# Patient Record
Sex: Female | Born: 1940 | Race: White | Hispanic: No | Smoking: Former smoker
Health system: Southern US, Community
[De-identification: ages and names within clinical notes are randomized; demographics above are authoritative.]

## PROBLEM LIST (undated history)

## (undated) DIAGNOSIS — E079 Disorder of thyroid, unspecified: Secondary | ICD-10-CM

## (undated) DIAGNOSIS — F32A Depression, unspecified: Secondary | ICD-10-CM

## (undated) DIAGNOSIS — M1990 Unspecified osteoarthritis, unspecified site: Secondary | ICD-10-CM

## (undated) DIAGNOSIS — M8580 Other specified disorders of bone density and structure, unspecified site: Secondary | ICD-10-CM

## (undated) DIAGNOSIS — F329 Major depressive disorder, single episode, unspecified: Secondary | ICD-10-CM

## (undated) HISTORY — PX: ABDOMINAL HYSTERECTOMY: SHX81

## (undated) HISTORY — DX: Unspecified osteoarthritis, unspecified site: M19.90

## (undated) HISTORY — DX: Other specified disorders of bone density and structure, unspecified site: M85.80

## (undated) HISTORY — DX: Disorder of thyroid, unspecified: E07.9

## (undated) HISTORY — DX: Depression, unspecified: F32.A

## (undated) HISTORY — PX: TUBAL LIGATION: SHX77

## (undated) HISTORY — DX: Major depressive disorder, single episode, unspecified: F32.9

## (undated) HISTORY — PX: CORONARY ARTERY BYPASS GRAFT: SHX141

---

## 1998-05-13 ENCOUNTER — Other Ambulatory Visit: Admission: RE | Admit: 1998-05-13 | Discharge: 1998-06-12 | Payer: Self-pay

## 1998-07-02 ENCOUNTER — Ambulatory Visit (HOSPITAL_COMMUNITY): Admission: RE | Admit: 1998-07-02 | Discharge: 1998-07-02 | Payer: Self-pay | Admitting: Gastroenterology

## 1999-03-30 ENCOUNTER — Other Ambulatory Visit: Admission: RE | Admit: 1999-03-30 | Discharge: 1999-03-30 | Payer: Self-pay

## 2004-06-15 ENCOUNTER — Other Ambulatory Visit: Admission: RE | Admit: 2004-06-15 | Discharge: 2004-06-15 | Payer: Self-pay | Admitting: Family Medicine

## 2005-03-03 ENCOUNTER — Ambulatory Visit (HOSPITAL_COMMUNITY): Admission: RE | Admit: 2005-03-03 | Discharge: 2005-03-03 | Payer: Self-pay | Admitting: Internal Medicine

## 2005-03-03 ENCOUNTER — Ambulatory Visit: Payer: Self-pay | Admitting: Internal Medicine

## 2005-03-03 ENCOUNTER — Encounter (INDEPENDENT_AMBULATORY_CARE_PROVIDER_SITE_OTHER): Payer: Self-pay | Admitting: *Deleted

## 2005-03-23 ENCOUNTER — Ambulatory Visit: Payer: Self-pay | Admitting: Psychiatry

## 2005-06-21 ENCOUNTER — Other Ambulatory Visit: Admission: RE | Admit: 2005-06-21 | Discharge: 2005-06-21 | Payer: Self-pay | Admitting: Family Medicine

## 2006-07-11 ENCOUNTER — Ambulatory Visit: Payer: Self-pay | Admitting: Gastroenterology

## 2006-07-17 ENCOUNTER — Ambulatory Visit: Payer: Self-pay | Admitting: Gastroenterology

## 2006-12-01 ENCOUNTER — Ambulatory Visit (HOSPITAL_COMMUNITY): Payer: Self-pay | Admitting: Psychiatry

## 2006-12-15 ENCOUNTER — Ambulatory Visit (HOSPITAL_COMMUNITY): Payer: Self-pay | Admitting: Psychiatry

## 2006-12-22 ENCOUNTER — Ambulatory Visit (HOSPITAL_COMMUNITY): Payer: Self-pay | Admitting: Psychiatry

## 2007-01-02 ENCOUNTER — Ambulatory Visit (HOSPITAL_COMMUNITY): Payer: Self-pay | Admitting: Psychiatry

## 2007-01-11 ENCOUNTER — Ambulatory Visit (HOSPITAL_COMMUNITY): Payer: Self-pay | Admitting: Psychiatry

## 2007-01-19 ENCOUNTER — Ambulatory Visit (HOSPITAL_COMMUNITY): Payer: Self-pay | Admitting: Psychiatry

## 2007-01-30 ENCOUNTER — Ambulatory Visit (HOSPITAL_COMMUNITY): Payer: Self-pay | Admitting: Psychiatry

## 2007-02-01 ENCOUNTER — Ambulatory Visit (HOSPITAL_COMMUNITY): Payer: Self-pay | Admitting: Psychiatry

## 2007-02-13 ENCOUNTER — Ambulatory Visit (HOSPITAL_COMMUNITY): Payer: Self-pay | Admitting: Psychiatry

## 2007-02-19 ENCOUNTER — Ambulatory Visit (HOSPITAL_COMMUNITY): Payer: Self-pay | Admitting: Psychiatry

## 2007-03-06 ENCOUNTER — Ambulatory Visit (HOSPITAL_COMMUNITY): Payer: Self-pay | Admitting: Psychiatry

## 2007-03-15 ENCOUNTER — Ambulatory Visit (HOSPITAL_COMMUNITY): Payer: Self-pay | Admitting: Psychiatry

## 2007-03-21 ENCOUNTER — Ambulatory Visit (HOSPITAL_COMMUNITY): Payer: Self-pay | Admitting: Psychiatry

## 2007-04-11 ENCOUNTER — Ambulatory Visit (HOSPITAL_COMMUNITY): Payer: Self-pay | Admitting: Psychiatry

## 2007-05-17 ENCOUNTER — Ambulatory Visit (HOSPITAL_COMMUNITY): Payer: Self-pay | Admitting: Psychiatry

## 2007-05-21 ENCOUNTER — Ambulatory Visit (HOSPITAL_COMMUNITY): Payer: Self-pay | Admitting: Psychiatry

## 2007-05-30 ENCOUNTER — Ambulatory Visit (HOSPITAL_COMMUNITY): Payer: Self-pay | Admitting: Psychiatry

## 2007-06-14 ENCOUNTER — Ambulatory Visit (HOSPITAL_COMMUNITY): Payer: Self-pay | Admitting: Psychiatry

## 2007-07-16 ENCOUNTER — Ambulatory Visit (HOSPITAL_COMMUNITY): Payer: Self-pay | Admitting: Psychiatry

## 2007-07-19 ENCOUNTER — Ambulatory Visit (HOSPITAL_COMMUNITY): Payer: Self-pay | Admitting: Psychiatry

## 2007-08-13 ENCOUNTER — Ambulatory Visit (HOSPITAL_COMMUNITY): Payer: Self-pay | Admitting: Psychiatry

## 2007-09-11 ENCOUNTER — Ambulatory Visit (HOSPITAL_COMMUNITY): Payer: Self-pay | Admitting: Psychiatry

## 2007-09-18 ENCOUNTER — Emergency Department (HOSPITAL_COMMUNITY): Admission: EM | Admit: 2007-09-18 | Discharge: 2007-09-18 | Payer: Self-pay | Admitting: Emergency Medicine

## 2007-09-18 ENCOUNTER — Ambulatory Visit (HOSPITAL_COMMUNITY): Payer: Self-pay | Admitting: Psychiatry

## 2007-10-17 ENCOUNTER — Ambulatory Visit (HOSPITAL_COMMUNITY): Payer: Self-pay | Admitting: Psychiatry

## 2007-10-29 ENCOUNTER — Ambulatory Visit (HOSPITAL_COMMUNITY): Payer: Self-pay | Admitting: Psychiatry

## 2007-11-15 ENCOUNTER — Ambulatory Visit (HOSPITAL_COMMUNITY): Payer: Self-pay | Admitting: Psychiatry

## 2007-12-13 ENCOUNTER — Ambulatory Visit (HOSPITAL_COMMUNITY): Payer: Self-pay | Admitting: Psychiatry

## 2007-12-14 ENCOUNTER — Ambulatory Visit (HOSPITAL_COMMUNITY): Payer: Self-pay | Admitting: Psychiatry

## 2008-01-15 ENCOUNTER — Ambulatory Visit (HOSPITAL_COMMUNITY): Payer: Self-pay | Admitting: Psychiatry

## 2008-02-18 ENCOUNTER — Ambulatory Visit (HOSPITAL_COMMUNITY): Payer: Self-pay | Admitting: Psychiatry

## 2008-03-13 ENCOUNTER — Ambulatory Visit (HOSPITAL_COMMUNITY): Payer: Self-pay | Admitting: Psychiatry

## 2008-05-08 ENCOUNTER — Ambulatory Visit (HOSPITAL_COMMUNITY): Payer: Self-pay | Admitting: Psychiatry

## 2008-05-20 ENCOUNTER — Ambulatory Visit (HOSPITAL_COMMUNITY): Payer: Self-pay | Admitting: Psychiatry

## 2008-06-03 ENCOUNTER — Ambulatory Visit (HOSPITAL_COMMUNITY): Payer: Self-pay | Admitting: Psychiatry

## 2008-07-08 ENCOUNTER — Ambulatory Visit (HOSPITAL_COMMUNITY): Payer: Self-pay | Admitting: Psychiatry

## 2008-07-18 ENCOUNTER — Ambulatory Visit (HOSPITAL_COMMUNITY): Payer: Self-pay | Admitting: Psychiatry

## 2008-08-01 ENCOUNTER — Ambulatory Visit (HOSPITAL_COMMUNITY): Payer: Self-pay | Admitting: Psychiatry

## 2008-10-15 ENCOUNTER — Ambulatory Visit (HOSPITAL_COMMUNITY): Payer: Self-pay | Admitting: Psychiatry

## 2008-11-12 ENCOUNTER — Ambulatory Visit (HOSPITAL_COMMUNITY): Payer: Self-pay | Admitting: Psychiatry

## 2008-11-20 ENCOUNTER — Ambulatory Visit (HOSPITAL_COMMUNITY): Payer: Self-pay | Admitting: Psychiatry

## 2008-11-26 ENCOUNTER — Ambulatory Visit (HOSPITAL_COMMUNITY): Payer: Self-pay | Admitting: Psychiatry

## 2008-12-17 ENCOUNTER — Ambulatory Visit (HOSPITAL_COMMUNITY): Payer: Self-pay | Admitting: Psychiatry

## 2008-12-18 ENCOUNTER — Ambulatory Visit (HOSPITAL_COMMUNITY): Payer: Self-pay | Admitting: Psychiatry

## 2008-12-26 ENCOUNTER — Ambulatory Visit (HOSPITAL_COMMUNITY): Payer: Self-pay | Admitting: Psychiatry

## 2009-01-13 ENCOUNTER — Ambulatory Visit (HOSPITAL_COMMUNITY): Payer: Self-pay | Admitting: Psychiatry

## 2009-02-20 ENCOUNTER — Ambulatory Visit (HOSPITAL_COMMUNITY): Payer: Self-pay | Admitting: Psychiatry

## 2009-04-16 ENCOUNTER — Ambulatory Visit (HOSPITAL_COMMUNITY): Payer: Self-pay | Admitting: Psychiatry

## 2009-04-21 ENCOUNTER — Ambulatory Visit (HOSPITAL_COMMUNITY): Payer: Self-pay | Admitting: Psychiatry

## 2009-06-09 ENCOUNTER — Ambulatory Visit (HOSPITAL_COMMUNITY): Payer: Self-pay | Admitting: Psychiatry

## 2009-06-11 ENCOUNTER — Ambulatory Visit (HOSPITAL_COMMUNITY): Payer: Self-pay | Admitting: Psychiatry

## 2009-08-11 ENCOUNTER — Ambulatory Visit (HOSPITAL_COMMUNITY): Payer: Self-pay | Admitting: Psychiatry

## 2009-08-13 ENCOUNTER — Encounter: Payer: Self-pay | Admitting: Gastroenterology

## 2009-09-23 ENCOUNTER — Encounter (INDEPENDENT_AMBULATORY_CARE_PROVIDER_SITE_OTHER): Payer: Self-pay | Admitting: *Deleted

## 2009-10-08 ENCOUNTER — Ambulatory Visit (HOSPITAL_COMMUNITY): Payer: Self-pay | Admitting: Psychiatry

## 2009-10-26 ENCOUNTER — Encounter (INDEPENDENT_AMBULATORY_CARE_PROVIDER_SITE_OTHER): Payer: Self-pay | Admitting: *Deleted

## 2009-10-26 ENCOUNTER — Ambulatory Visit: Payer: Self-pay | Admitting: Gastroenterology

## 2009-10-26 DIAGNOSIS — R1319 Other dysphagia: Secondary | ICD-10-CM

## 2009-10-26 DIAGNOSIS — R195 Other fecal abnormalities: Secondary | ICD-10-CM

## 2009-10-26 DIAGNOSIS — K5900 Constipation, unspecified: Secondary | ICD-10-CM | POA: Insufficient documentation

## 2009-10-26 DIAGNOSIS — K921 Melena: Secondary | ICD-10-CM | POA: Insufficient documentation

## 2009-11-19 ENCOUNTER — Ambulatory Visit (HOSPITAL_COMMUNITY): Payer: Self-pay | Admitting: Psychiatry

## 2009-12-14 ENCOUNTER — Telehealth (INDEPENDENT_AMBULATORY_CARE_PROVIDER_SITE_OTHER): Payer: Self-pay | Admitting: *Deleted

## 2009-12-15 ENCOUNTER — Ambulatory Visit: Payer: Self-pay | Admitting: Gastroenterology

## 2010-02-05 ENCOUNTER — Ambulatory Visit (HOSPITAL_COMMUNITY): Payer: Self-pay | Admitting: Psychiatry

## 2010-02-16 ENCOUNTER — Ambulatory Visit (HOSPITAL_COMMUNITY): Payer: Self-pay | Admitting: Psychiatry

## 2010-02-19 ENCOUNTER — Ambulatory Visit (HOSPITAL_COMMUNITY): Payer: Self-pay | Admitting: Psychiatry

## 2010-03-03 ENCOUNTER — Ambulatory Visit (HOSPITAL_COMMUNITY): Payer: Self-pay | Admitting: Psychiatry

## 2010-04-02 ENCOUNTER — Ambulatory Visit (HOSPITAL_COMMUNITY): Payer: Self-pay | Admitting: Psychiatry

## 2010-04-06 ENCOUNTER — Ambulatory Visit (HOSPITAL_COMMUNITY): Payer: Self-pay | Admitting: Psychiatry

## 2010-04-23 ENCOUNTER — Ambulatory Visit (HOSPITAL_COMMUNITY): Payer: Self-pay | Admitting: Psychiatry

## 2010-05-25 ENCOUNTER — Ambulatory Visit (HOSPITAL_COMMUNITY): Payer: Self-pay | Admitting: Psychiatry

## 2010-06-03 ENCOUNTER — Ambulatory Visit (HOSPITAL_COMMUNITY): Payer: Self-pay | Admitting: Psychiatry

## 2010-07-01 ENCOUNTER — Ambulatory Visit (HOSPITAL_COMMUNITY): Payer: Self-pay | Admitting: Psychiatry

## 2010-07-29 ENCOUNTER — Ambulatory Visit (HOSPITAL_COMMUNITY): Payer: Self-pay | Admitting: Psychiatry

## 2010-08-12 ENCOUNTER — Ambulatory Visit (HOSPITAL_COMMUNITY): Payer: Self-pay | Admitting: Psychiatry

## 2010-08-16 ENCOUNTER — Ambulatory Visit (HOSPITAL_COMMUNITY): Payer: Self-pay | Admitting: Psychiatry

## 2010-10-14 ENCOUNTER — Ambulatory Visit (HOSPITAL_COMMUNITY): Payer: Self-pay | Admitting: Psychiatry

## 2010-12-07 NOTE — Progress Notes (Signed)
Summary: colon prep  ---- Converted from flag ---- ---- 12/14/2009 4:00 PM, Meryl Dare MD [facility] wrote: Take one bottle of Mag Citrate 1-2 hours before MoviPrep tonight. Push clear liquids tonight. No solid food tonight or tomorrow. Go ahead with procedures as scheduled. MS  ---- 12/14/2009 3:56 PM, Jennye Boroughs RN wrote: Scheduled for EGD/COLON tomorrow 2/7 and ate oatmeal for breakfast this morning and sausage and eggs for lunch.  Do you still want her to have procedure? ------------------------------  Phone Note Call from Patient   Summary of Call: Returned pts. phone call and advised her of Dr. Ardell Isaacs recommendations.  Pt. verbalized understanding. Informed her that we would proceed with procedure tomorrow as planned. Initial call taken by: Jennye Boroughs RN,  December 14, 2009 4:30 PM

## 2010-12-07 NOTE — Procedures (Signed)
Summary: Colonoscopy  Patient: Haley Wang Note: All result statuses are Final unless otherwise noted.  Tests: (1) Colonoscopy (COL)   COL Colonoscopy           DONE     [facility]     [address]     [place], [place]  [address]           COLONOSCOPY PROCEDURE REPORT           PATIENT:  Haley, Wang  MR#:  [id number]     BIRTHDATE:  September 14, 1941, 68 yrs. old  GENDER:  female           ENDOSCOPIST:  Judie Petit T. Russella Dar, MD, [facility]           PROCEDURE DATE:  12/15/2009     PROCEDURE:  Colonoscopy 18841     ASA CLASS:  Class II     INDICATIONS:  1) FOBT positive stool  2) hematochezia  3) family     history of colon cancer, brother with colon cancer at 42.           MEDICATIONS:   Fentanyl 125 mcg IV, Versed 10 mg IV           DESCRIPTION OF PROCEDURE:   After the risks benefits and     alternatives of the procedure were thoroughly explained, informed     consent was obtained.  Digital rectal exam was performed and     revealed moderate external hemorrhoids. The LB PCF-H180AL [id number]     endoscope was introduced through the anus and advanced to the     cecum, which was identified by both the appendix and ileocecal     valve, limited by a tortuous colon.    The quality of the prep was     good, using MoviPrep.  The instrument was then slowly withdrawn as     the colon was fully examined.     <<PROCEDUREIMAGES>>           FINDINGS:  A normal appearing cecum, ileocecal valve, and     appendiceal orifice were identified. The ascending, hepatic     flexure, transverse, splenic flexure, descending, sigmoid colon,     and rectum appeared unremarkable.   Retroflexed views in the     rectum revealed internal hemorrhoids, small.  The time to cecum =     7.25  minutes. The scope was then withdrawn (time =  10  min) from     the patient and the procedure completed.           COMPLICATIONS:  None           ENDOSCOPIC IMPRESSION:     1) Normal colon     2) Internal and external  hemorrhoids           RECOMMENDATIONS:     1) Repeat Colonoscopy in 5 years.     2) EGD today           Sheehan Stacey T. Russella Dar, MD, [facility]           CC: Paulita Cradle, NP           n.     Rosalie DoctorVenita Lick. Garry Bochicchio at 12/15/2009 10:56 AM           Leonette Nutting, [id number]  Note: An exclamation mark (!) indicates a result that was not dispersed into the flowsheet. Document Creation Date: 12/15/2009 10:57 AM _______________________________________________________________________  (1) Order result status:  Final Collection or observation date-time: 12/15/2009 10:53 Requested date-time:  Receipt date-time:  Reported date-time:  Referring Physician:   Ordering Physician: Claudette Head [phone]) Specimen Source:  Source: Launa Grill Order Number: [id number] Lab site:   Appended Document: Colonoscopy    Clinical Lists Changes  Observations: Added new observation of COLONNXTDUE: 12/2014 (12/15/2009 11:19)

## 2010-12-07 NOTE — Procedures (Signed)
Summary: Particia Wang  Patient: Haley Wang Note: All result statuses are Final unless otherwise noted.  Tests: (1) Endo Savary (ESA)   ESA Endo Savary           DONE     [facility]     [address]     [place], [place]  [address]           ENDOSCOPY PROCEDURE REPORT           PATIENT:  Haley Wang, Bruyere  MR#:  [id number]     BIRTHDATE:  18-Nov-1940, 68 yrs. old  GENDER:  female           ENDOSCOPIST:  Judie Petit T. Russella Dar, MD, [facility]           PROCEDURE DATE:  12/15/2009     PROCEDURE:  EGD with dilatation over guidewire     ASA CLASS:  Class II     INDICATIONS:  dysphagia, FOBT + stool           MEDICATIONS:  There was residual sedation effect present from     prior procedure, Versed 2 mg IV     TOPICAL ANESTHETIC:  Exactacain Spray           DESCRIPTION OF PROCEDURE:   After the risks benefits and     alternatives of the procedure were thoroughly explained, informed     consent was obtained.  The LB GIF-H180 [id number] endoscope was     introduced through the mouth and advanced to the second portion of     the duodenum, without limitations.  The instrument was slowly     withdrawn as the mucosa was fully examined.     <<PROCEDUREIMAGES>>           The esophagus and gastroesophageal junction were completely normal     in appearance. The stomach was entered and closely examined. The     pylorus, antrum, angularis, and lesser curvature were well     visualized, including a retroflexed view of the cardia and fundus.     The stomach wall was normally distensable. The scope passed easily     through the pylorus into the duodenum.  The duodenal bulb was     normal in appearance, as was the postbulbar duodenum.  Retroflexed     views revealed no abnormalities.  A 17 mm Savary dilation over a     guidewire was performed for dysphagia without a stricture. No heme     or resistance noted. The scope was then withdrawn from the patient     and the procedure completed.        COMPLICATIONS:  None           ENDOSCOPIC IMPRESSION:     1) Normal EGD           RECOMMENDATIONS:     1) post dilation instructions     2) follow-up: PCP as planned           Holiday Mcmenamin T. Russella Dar, MD, [facility]           CC:  Paulita Cradle, NP           n.     Rosalie DoctorVenita Lick. Brennyn Haisley at 12/15/2009 11:07 AM           Karyna, Bessler, [id number]  Note: An exclamation mark (!) indicates a result that was not dispersed into the flowsheet. Document Creation Date: 12/15/2009 11:07 AM _______________________________________________________________________  (1) Order  result status: Final Collection or observation date-time: 12/15/2009 11:02 Requested date-time:  Receipt date-time:  Reported date-time:  Referring Physician:   Ordering Physician: Claudette Head [phone]) Specimen Source:  Source: Launa Grill Order Number: [id number] Lab site:

## 2010-12-14 ENCOUNTER — Encounter (INDEPENDENT_AMBULATORY_CARE_PROVIDER_SITE_OTHER): Payer: Medicare FFS | Admitting: Psychiatry

## 2010-12-14 DIAGNOSIS — F331 Major depressive disorder, recurrent, moderate: Secondary | ICD-10-CM

## 2011-02-10 ENCOUNTER — Encounter (HOSPITAL_COMMUNITY): Payer: Medicaid Other | Admitting: Psychiatry

## 2011-02-24 ENCOUNTER — Encounter (INDEPENDENT_AMBULATORY_CARE_PROVIDER_SITE_OTHER): Payer: Medicare Other | Admitting: Psychiatry

## 2011-02-24 DIAGNOSIS — F331 Major depressive disorder, recurrent, moderate: Secondary | ICD-10-CM

## 2011-03-08 ENCOUNTER — Encounter (HOSPITAL_COMMUNITY): Payer: Medicare Other | Admitting: Psychiatry

## 2011-03-15 ENCOUNTER — Encounter (HOSPITAL_COMMUNITY): Payer: Medicare Other | Admitting: Psychiatry

## 2011-03-17 ENCOUNTER — Encounter (HOSPITAL_COMMUNITY): Payer: Medicare Other | Admitting: Psychiatry

## 2011-03-25 NOTE — Op Note (Signed)
NAMERENDA, POHLMAN                 ACCOUNT NO.:  [id number]   MEDICAL RECORD NO.:  [id number]          PATIENT TYPE:  AMB   LOCATION:  DAY                           FACILITY:  [facility]   PHYSICIAN:  Lionel December, M.D.    DATE OF BIRTH:  [birth year]   DATE OF PROCEDURE:  03/03/2005  DATE OF DISCHARGE:  03/03/2005                                 OPERATIVE REPORT   PROCEDURE:  Esophageal manometry.   INDICATIONS:  Ms. Florentino is a 70 year old Caucasian female with intermittent  heartburn and feeling of a knot in her throat, who is referred through  courtesy of Dr. Gabriel Cirri for esophageal manometry.  The procedure risks were  reviewed with the patient, informed consent was obtained.   FINDINGS:  Esophageal body, 10 out of 10 swallows were peristaltic.   Mean amplitude at 13 cm, 66 mmHg, at 8 cm 87 mmHg, at 3 cm 147 mmHg, mean in  distal two ports is 117.   Mean duration at 13 cm is 2.9, at 8 cm is 4.2, at 3 cm for 4.9 seconds, and  means of the distal two ports is 4.5 seconds.   Lower esophageal sphincter located between 42 and 45 cm from the nares.   Resting LES pressure is 28.5 mmHg.   Relaxation is 55.4%.   IMPRESSION:  1.  Normal esophageal body study.  2.  Next normal resting lower esophageal sphincter pressure with incomplete      relaxation.   No criterion met for nutcracker esophagus or diffuse esophageal spasm.   If the patient has dysphagia, may consider calcium channel blocker or barium  study with a pill.      NR/MEDQ  D:  03/13/2005  T:  03/14/2005  Job:  [id number]   cc:   Erskine Speed, M.D.  [address], [place]  [place]  [place] [address]  Fax: [phone]   Prudy Feeler, P.A.-C.  Dr. Joyce Copa office

## 2011-03-25 NOTE — Assessment & Plan Note (Signed)
[facility]                           GASTROENTEROLOGY OFFICE NOTE   Haley Wang, Haley Wang                        MRN:          [id number]  DATE:07/11/2006                            DOB:          [birth year]    REFERRING PHYSICIAN:  Paulita Cradle, NP   REASON FOR REFERRAL:  Constipation, abdominal bloating, and rectal bleeding.   HISTORY OF PRESENT ILLNESS:  Haley Wang is a 70 year old white female who  relates intermittent problems with constipation over the years.  Her  symptoms have substantially worsened over the past 6 months and she has had  little relief with stool softeners and laxatives.  She has tried MiraLax on  a daily basis which led to more bloating and did not adequately relieve her  constipation.  She has not used it at a higher frequency.  She states she  underwent a colonoscopy by Dr. Gabriel Cirri in [place] within the past 2-5 years  that was unremarkable.  She also has had an upper endoscopy by Dr. Gabriel Cirri.  An esophageal manometry was done by Dr. Lionel December, on March 03, 2005,  for a sensation of a knot in her throat.  This study demonstrated incomplete  lower esophageal relaxation and was otherwise normal.  She has small amounts  of bright red blood per rectum with bowel movements and relates frequent  gas, bloating, and lower abdominal discomfort.  She has been on a higher  dose of Effexor X-RAY and Zyprexa for the past few months.  She has a  brother who developed colon cancer at age 47 and a sister with colon polyps.  No other family members with colon cancer, colon polyps, or inflammatory  bowel disease.  She denies any weight loss, diarrhea, or change in stool  caliber.   PAST MEDICAL HISTORY:  1. Hypothyroidism.  2. Depression.  3. Osteopenia.  4. Irritable bowel syndrome, constipation predominant.  5. Status post cholecystectomy.  6. Status post hemorrhoidectomy.  7. Status post hysterectomy.   MEDICATIONS:  Listed on  the maintenance medication sheet, updated and  reviewed.   MEDICATION ALLERGIES:  CODEINE.   SOCIAL HISTORY:  See the gastroenterology evaluation form.   REVIEW OF SYSTEMS:  See the gastroenterology evaluation form.   PHYSICAL EXAMINATION:  GENERAL:  A well-developed, well-nourished, white  female in no acute distress.  VITAL SIGNS:  Height 5 feet 3.5 inches, weight 160 pounds, blood pressure is  134/72, pulse 64 and regular.  HEENT:  Anicteric sclerae.  Oropharynx clear.  CHEST:  Clear to auscultation bilaterally.  CARDIAC:  Regular rate and rhythm without murmurs appreciated.  ABDOMEN:  Soft, nontender, nondistended.  Normoactive bowel sounds.  No  palpable organomegaly, masses, or hernias.  RECTAL:  Deferred to time of colonoscopy.  EXTREMITIES:  Without clubbing, cyanosis, or edema.  NEUROLOGIC:  Alert and oriented x3.  Grossly nonfocal.   LABORATORY DATA:  Comprehensive metabolic panel and thyroid panel with TSH  on June 21, 2006, were all normal.   ASSESSMENT/PLAN:  1. Worsening chronic constipation with lower abdominal discomfort and      bloating.  2.  Hematochezia.  3. First degree relative with colon cancer at age 69.  4. Constipation may have been exacerbated by higher doses of Effexor and      the addition of Zyprexa.  She will discuss with Paulita Cradle, NP      about decreasing dosages or discontinuing one or both of these      medications.  She is to maintain a high fiber diet with adequate fluid      intake, increase MiraLax to twice daily, three times a day, or four      times a day.  She will titrate the dosage as needed to maintain      adequate bowel movements.  Need to exclude colorectal neoplasm and      further investigate the hematochezia.  Risks, benefits, and      alternatives to colonoscopy with possible biopsy and possible      polypectomy discussed with the patient.  She intends to proceed.  This      will be scheduled electively.                                    Venita Lick. Russella Dar, MD, [facility]   MTS/MedQ  DD:  07/11/2006  DT:  07/11/2006  Job #:  [id number]   cc:   Paulita Cradle, NP

## 2011-03-29 ENCOUNTER — Encounter (HOSPITAL_COMMUNITY): Payer: Medicare Other | Admitting: Psychiatry

## 2011-04-07 ENCOUNTER — Encounter (INDEPENDENT_AMBULATORY_CARE_PROVIDER_SITE_OTHER): Payer: Medicare Other | Admitting: Psychiatry

## 2011-04-07 DIAGNOSIS — F331 Major depressive disorder, recurrent, moderate: Secondary | ICD-10-CM

## 2011-05-05 ENCOUNTER — Encounter (INDEPENDENT_AMBULATORY_CARE_PROVIDER_SITE_OTHER): Payer: Medicare Other | Admitting: Psychiatry

## 2011-05-05 DIAGNOSIS — F331 Major depressive disorder, recurrent, moderate: Secondary | ICD-10-CM

## 2011-05-20 ENCOUNTER — Encounter (HOSPITAL_COMMUNITY): Payer: Medicare Other | Admitting: Psychiatry

## 2011-06-16 ENCOUNTER — Encounter (INDEPENDENT_AMBULATORY_CARE_PROVIDER_SITE_OTHER): Payer: Medicare Other | Admitting: Psychiatry

## 2011-06-16 DIAGNOSIS — F331 Major depressive disorder, recurrent, moderate: Secondary | ICD-10-CM

## 2011-06-24 ENCOUNTER — Encounter (HOSPITAL_COMMUNITY): Payer: Medicare Other | Admitting: Psychiatry

## 2011-07-08 ENCOUNTER — Encounter (HOSPITAL_COMMUNITY): Payer: Medicare Other | Admitting: Psychiatry

## 2011-07-28 ENCOUNTER — Encounter (INDEPENDENT_AMBULATORY_CARE_PROVIDER_SITE_OTHER): Payer: Medicare Other | Admitting: Psychiatry

## 2011-07-28 DIAGNOSIS — F331 Major depressive disorder, recurrent, moderate: Secondary | ICD-10-CM

## 2011-08-16 LAB — URINALYSIS, ROUTINE W REFLEX MICROSCOPIC
Glucose, UA: NEGATIVE
Ketones, ur: NEGATIVE
Protein, ur: 100 — AB
pH: 7

## 2011-08-16 LAB — URINE MICROSCOPIC-ADD ON

## 2011-08-30 ENCOUNTER — Encounter (HOSPITAL_COMMUNITY): Payer: Medicare Other | Admitting: Psychiatry

## 2011-09-06 ENCOUNTER — Encounter (INDEPENDENT_AMBULATORY_CARE_PROVIDER_SITE_OTHER): Payer: Medicare Other | Admitting: Psychiatry

## 2011-09-06 DIAGNOSIS — F331 Major depressive disorder, recurrent, moderate: Secondary | ICD-10-CM

## 2011-10-07 ENCOUNTER — Other Ambulatory Visit: Payer: Self-pay | Admitting: Family Medicine

## 2011-10-07 DIAGNOSIS — R4182 Altered mental status, unspecified: Secondary | ICD-10-CM

## 2011-10-08 ENCOUNTER — Other Ambulatory Visit (HOSPITAL_COMMUNITY): Payer: Self-pay

## 2011-10-12 ENCOUNTER — Other Ambulatory Visit (HOSPITAL_COMMUNITY): Payer: Medicare Other

## 2011-10-17 ENCOUNTER — Inpatient Hospital Stay (HOSPITAL_COMMUNITY)
Admission: RE | Admit: 2011-10-17 | Discharge: 2011-10-17 | Payer: Medicare Other | Source: Ambulatory Visit | Attending: Family Medicine | Admitting: Family Medicine

## 2011-11-29 ENCOUNTER — Encounter (HOSPITAL_COMMUNITY): Payer: Medicare Other | Admitting: Psychiatry

## 2011-11-29 ENCOUNTER — Ambulatory Visit (HOSPITAL_COMMUNITY): Payer: Medicare Other | Admitting: Psychiatry

## 2012-02-21 ENCOUNTER — Encounter (HOSPITAL_COMMUNITY): Payer: Self-pay | Admitting: Psychiatry

## 2012-02-21 ENCOUNTER — Ambulatory Visit (INDEPENDENT_AMBULATORY_CARE_PROVIDER_SITE_OTHER): Payer: Medicare Other | Admitting: Psychiatry

## 2012-02-21 VITALS — BP 120/75 | HR 75 | Wt 139.6 lb

## 2012-02-21 DIAGNOSIS — F329 Major depressive disorder, single episode, unspecified: Secondary | ICD-10-CM

## 2012-02-21 MED ORDER — QUETIAPINE FUMARATE 50 MG PO TABS: 50.0000 mg | ORAL_TABLET | Freq: Every day | ORAL | Status: DC

## 2012-02-21 MED ORDER — VENLAFAXINE HCL ER 75 MG PO CP24: 150.0000 mg | ORAL_CAPSULE | Freq: Every day | ORAL | Status: DC

## 2012-02-21 MED ORDER — CLONAZEPAM 0.5 MG PO TABS: 0.2500 mg | ORAL_TABLET | Freq: Every day | ORAL | Status: DC

## 2012-02-21 NOTE — Progress Notes (Signed)
Chief complaint I need my medication I am feeling very depressed.  History of presenting illness Patient is 71 year old Caucasian widowed unemployed female who came with her daughter for her appointment.  Patient was last seen in October 2012.  At that time she was taking Effexor, Klonopin and Seroquel.  Patient reported she was doing better with the medication and then she decided that she does not need any more medication.  Initially she felt okay but then gradually she started to feel more depressed anxious and tearful.  She start taking Seroquel from her primary care physician but she ran out 2 weeks ago.  She complained of insomnia, crying spells, passive suicidal thinking and anxiety attack.  She endorse socially isolated withdrawn and does not want to leave the house.  She endorse anhedonia with feeling of hopelessness and helplessness.  Her daughter also endorse that she needs to be on medication.  Patient also has significant memory problem, she does not remember appointments and medication dosage.  Patient endorse passive suicidal thinking but denies any paranoia or any hallucination.  However she endorse significant negative thinking and social isolation.  She denies any agitation or anger.  She does not report any side effects of psychiatric medication which she was taking 3 months ago.  She is living by herself however her daughter visit her frequently.  One of her son admitted in psychiatric mental hospital for many years .  Patient is very concerned about her son .  Son usually comes on visit pass on weekend .  Patient wants to go back on medication .  Past psychiatric history Patient has been seen in this office since 2006 .  She was seeing on and off and then there has been time when she does not come for the appointment and remains noncompliant with medication .  Patient has a long history of depression and anxiety.  She was referred to [place] from her primary care physician due to significant  depression.  Patient has 1 history of suicidal attempt a few years ago when she took overdose on medication and required hospitalization at [facility].  In the past she had a good response on Effexor Seroquel and Klonopin.  She denies any history of psychosis or mania.    Medical history Patient has history of hyperlipidemia, tired disease, irritable all syndrome, arthritis , memory impairment , osteopenia and vitamin D deficiency.  Psychosocial history Patient was born and raised in [place].  She grew up in a big family.  She has 6 brother and sisters.  Her husband died in 60.  She has 4 children.  She lives by herself however her daughter frequently visits her.  Mental status emanation Patient is casually dressed and fairly groomed.  She appears tearful anxious and easily emotional.  Her speech is fast and rapid.  Her thought process is also loose at times.  She has difficulty recalling medication dosage.  Her attention and concentration is poor.  She denies any active or passive suicidal thinking but endorse her mood is depressed sad and anxious.  Her affect is constricted.  She denies any auditory or visual hallucination.  Her attention and concentration is poor.  She's alert and oriented x3 but difficulty recalling old events.  Her insight and judgment is fair.  Her impulse control is okay  Assessment Axis I Major depressive disorder Axis II deferred Axis III see medical history Axis IV mild to moderate Axis V 55  Plan I talked to the patient  in length and her daughter Misty Stanley about her noncompliance with medication and followup leading to decompensation.  Patient and daughter agreed and now willing to restart medication therapy in this office.  Since patient has been out of her psychiatric medication for a while I will restart medication in a small dose.  I will restart Effexor 75 mg, Seroquel 50 mg and Klonopin 0.25 mg.  I explained risks and benefits of medication in  detail.  I recommended to call us if she has any question or concern about the medication or if she feels worsening of the symptoms.  I will see her again in 3 weeks.  Time spent 30 minutes.

## 2012-03-06 ENCOUNTER — Ambulatory Visit (HOSPITAL_COMMUNITY): Payer: Self-pay | Admitting: Psychiatry

## 2012-03-07 ENCOUNTER — Other Ambulatory Visit (HOSPITAL_COMMUNITY): Payer: Self-pay | Admitting: Psychiatry

## 2012-03-07 DIAGNOSIS — F329 Major depressive disorder, single episode, unspecified: Secondary | ICD-10-CM

## 2012-03-07 NOTE — Telephone Encounter (Signed)
Refilled per Dr.Arfeen's instructions.Patient is titrating medication, dosage will be reviewed during appointment on 03/15/12

## 2012-03-15 ENCOUNTER — Ambulatory Visit (HOSPITAL_COMMUNITY): Payer: Self-pay | Admitting: Psychiatry

## 2013-01-28 ENCOUNTER — Encounter: Payer: Self-pay | Admitting: Nurse Practitioner

## 2013-01-31 DIAGNOSIS — R079 Chest pain, unspecified: Secondary | ICD-10-CM

## 2013-02-06 ENCOUNTER — Other Ambulatory Visit: Payer: PRIVATE HEALTH INSURANCE

## 2013-02-14 ENCOUNTER — Encounter: Payer: Self-pay | Admitting: Nurse Practitioner

## 2013-02-14 ENCOUNTER — Ambulatory Visit (INDEPENDENT_AMBULATORY_CARE_PROVIDER_SITE_OTHER): Payer: PRIVATE HEALTH INSURANCE | Admitting: Nurse Practitioner

## 2013-02-14 VITALS — BP 135/74 | HR 64 | Temp 97.0°F | Ht 63.0 in | Wt 146.0 lb

## 2013-02-14 DIAGNOSIS — F329 Major depressive disorder, single episode, unspecified: Secondary | ICD-10-CM

## 2013-02-14 DIAGNOSIS — E785 Hyperlipidemia, unspecified: Secondary | ICD-10-CM

## 2013-02-14 DIAGNOSIS — F341 Dysthymic disorder: Secondary | ICD-10-CM

## 2013-02-14 DIAGNOSIS — K5900 Constipation, unspecified: Secondary | ICD-10-CM

## 2013-02-14 DIAGNOSIS — E039 Hypothyroidism, unspecified: Secondary | ICD-10-CM

## 2013-02-14 DIAGNOSIS — F418 Other specified anxiety disorders: Secondary | ICD-10-CM | POA: Insufficient documentation

## 2013-02-14 DIAGNOSIS — R5383 Other fatigue: Secondary | ICD-10-CM

## 2013-02-14 LAB — THYROID PANEL WITH TSH
T4, Total: 7.6 ug/dL (ref 5.0–12.5)
TSH: 1.114 u[IU]/mL (ref 0.350–4.500)

## 2013-02-14 LAB — COMPLETE METABOLIC PANEL WITH GFR
BUN: 13 mg/dL (ref 6–23)
CO2: 29 mEq/L (ref 19–32)
Creat: 0.73 mg/dL (ref 0.50–1.10)
GFR, Est African American: >89 mL/min
GFR, Est Non African American: 82 mL/min
Glucose, Bld: 88 mg/dL (ref 70–99)
Total Bilirubin: 0.6 mg/dL (ref 0.3–1.2)
Total Protein: 6.3 g/dL (ref 6.0–8.3)

## 2013-02-14 LAB — ANEMIA PANEL 7
ABS Retic: 48.3 10*3/uL (ref 19.0–186.0)
Ferritin: 30 ng/mL (ref 10–291)
HCT: 41.4 % (ref 36.0–46.0)
Hemoglobin: 14 g/dL (ref 12.0–15.0)
RBC.: 4.39 MIL/uL (ref 3.87–5.11)
RBC: 4.39 MIL/uL (ref 3.87–5.11)
RDW: 13.8 % (ref 11.5–15.5)
TIBC: 350 ug/dL (ref 250–470)
WBC: 5.4 10*3/uL (ref 4.0–10.5)

## 2013-02-14 MED ORDER — CLONAZEPAM 0.5 MG PO TABS: 0.2500 mg | ORAL_TABLET | Freq: Every day | ORAL | Status: DC

## 2013-02-14 MED ORDER — VENLAFAXINE HCL ER 75 MG PO CP24: 150.0000 mg | ORAL_CAPSULE | Freq: Every day | ORAL | Status: DC

## 2013-02-14 MED ORDER — POLYETHYLENE GLYCOL 3350 17 GM/SCOOP PO POWD: 17.0000 g | Freq: Two times a day (BID) | ORAL | Status: DC | PRN

## 2013-02-14 NOTE — Patient Instructions (Signed)

## 2013-02-14 NOTE — Progress Notes (Signed)
  Subjective:    Patient ID: Genia Harold, female    DOB: 07-Aug-1941, 72 y.o.   MRN: [id number]  HPI Hypothyroidism This is a chronic disease for the patient. X 20 years. Taking Synthroid 75 mcg. Pt reports feeling fatigued for 2-3 months.   Depression with anxiety Pt reports crying for no reason, does not feel like doing anything or seeing anyone. Daughter states she worries about things she cannot control. Was going to [facility] but unable to continue due to transportation. Pt is interested in start attending again. Pt's daughter is going to talk to [employer] about getting transportation to and from appts. Pt does not feel medication is helping with symptoms.  Hyperlipidemia Pt walks 15-20 minutes a day. Taking Lipitor 10 mg QD. Diet low in fatty foods. Does eat a lot of sweet foods.  Pt has intermittent constipation. Will go 4-5 days without bowel movement  Review of Systems  Constitutional: Positive for fatigue.  HENT: Negative.   Eyes: Negative.   Respiratory: Negative for cough and shortness of breath.   Gastrointestinal: Positive for constipation (goes 4-5 days without bowel movement).  Endocrine: Negative.   Genitourinary: Negative.   Musculoskeletal: Negative.   Skin: Negative.   Neurological: Positive for dizziness (when rising from sitting only). Negative for headaches.  Psychiatric/Behavioral: Positive for sleep disturbance (has nightmares at least once a week). Negative for suicidal ideas and self-injury. The patient is nervous/anxious.        Objective:   Physical Exam  Constitutional: She is oriented to person, place, and time. She appears well-developed and well-nourished.  HENT:  Nose: Nose normal.  Mouth/Throat: Oropharynx is clear and moist.  Eyes: EOM are normal.  Neck: Trachea normal, normal range of motion and full passive range of motion without pain. Neck supple. No JVD present. Carotid bruit is not present. No thyromegaly present.   Cardiovascular: Normal rate, regular rhythm, normal heart sounds and intact distal pulses.  Exam reveals no gallop and no friction rub.   No murmur heard. Pulmonary/Chest: Effort normal and breath sounds normal.  Abdominal: Soft. Bowel sounds are normal. She exhibits no distension and no mass. There is no tenderness.  Musculoskeletal: Normal range of motion.  Lymphadenopathy:    She has no cervical adenopathy.  Neurological: She is alert and oriented to person, place, and time. She has normal reflexes.  Skin: Skin is warm and dry.  Psychiatric: She has a normal mood and affect. Her behavior is normal. Judgment and thought content normal.      BP 135/74  Pulse 64  Temp(Src) 97 F (36.1 C) (Oral)  Ht 5\' 3"  (1.6 m)  Wt 146 lb (66.225 kg)  BMI 25.87 kg/m2     Assessment & Plan:  1. Depression with anxiety Stress management - venlafaxine XR (EFFEXOR-XR) 75 MG 24 hr capsule; Take 2 capsules (150 mg total) by mouth daily.  Dispense: 30 capsule; Refill: 3 - clonazePAM (KLONOPIN) 0.5 MG tablet; Take 0.5 tablets (0.25 mg total) by mouth at bedtime.  Dispense: 30 tablet; Refill: 0  2. Unspecified hypothyroidism - Thyroid Panel With TSH   5. Other malaise and fatigue - Anemia panel 7  6. Other and unspecified hyperlipidemia Low fat diet Exercise - COMPLETE METABOLIC PANEL WITH GFR - NMR Lipoprofile with Lipids  Mary-Margaret Daphine Deutscher, FNP

## 2013-02-15 ENCOUNTER — Telehealth: Payer: Self-pay | Admitting: *Deleted

## 2013-02-15 LAB — NMR LIPOPROFILE WITH LIPIDS
HDL Particle Number: 39.3 umol/L (ref >=30.5)
LDL Size: 21.3 nm (ref >20.5)
Large HDL-P: 11.9 umol/L (ref >=4.8)
Large VLDL-P: 4.8 nmol/L — ABNORMAL HIGH (ref <=2.7)
Small LDL Particle Number: 276 nmol/L (ref <=527)

## 2013-02-15 NOTE — Telephone Encounter (Signed)
Message copied by Almeta Monas on Fri Feb 15, 2013 12:43 PM ------      Message from: Bennie Pierini      Created: Fri Feb 15, 2013 11:32 AM       All labs WNL- Continue all meds- recheck in 3 months ------

## 2013-02-15 NOTE — Telephone Encounter (Signed)
Aware of results. 

## 2013-02-20 ENCOUNTER — Telehealth: Payer: Self-pay | Admitting: Nurse Practitioner

## 2013-02-20 ENCOUNTER — Other Ambulatory Visit: Payer: Self-pay | Admitting: Nurse Practitioner

## 2013-02-20 MED ORDER — CLONAZEPAM 0.5 MG PO TABS: 0.5000 mg | ORAL_TABLET | Freq: Two times a day (BID) | ORAL | Status: DC | PRN

## 2013-02-20 NOTE — Telephone Encounter (Signed)
Spoke with Kim

## 2013-02-25 ENCOUNTER — Encounter: Payer: Self-pay | Admitting: Nurse Practitioner

## 2013-03-21 ENCOUNTER — Other Ambulatory Visit: Payer: Self-pay

## 2013-03-21 MED ORDER — CLONAZEPAM 0.5 MG PO TABS: 0.5000 mg | ORAL_TABLET | Freq: Two times a day (BID) | ORAL | Status: DC | PRN

## 2013-03-21 NOTE — Telephone Encounter (Signed)
Last seen 02/14/13  Last written 02/20/13    Call in and have nurse notify patient

## 2013-03-26 ENCOUNTER — Telehealth: Payer: Self-pay | Admitting: Family Medicine

## 2013-03-26 NOTE — Telephone Encounter (Signed)
Make sure that this was completed last week. This note indicates that it may not have been done. Make sure that she is being seen regularly.

## 2013-03-26 NOTE — Telephone Encounter (Signed)
I advised that we phone that prescription in on 5/15 and gave them the information. They advised that usually when they fill the medication they fill it for 56 pills instead of the prescribed 60 due to the way it is packaged. She stated that it comes in weekly dosing packs so they give 4 packs and that equals the 56 so its a 28 day supply versus the 30 day supply. Just an [place]

## 2013-03-26 NOTE — Telephone Encounter (Signed)
Please advise 

## 2013-03-27 NOTE — Telephone Encounter (Signed)
RX called into pharmacy

## 2013-04-08 ENCOUNTER — Telehealth: Payer: Self-pay | Admitting: Nurse Practitioner

## 2013-04-08 NOTE — Telephone Encounter (Signed)
APPT MADE FOR 6/9

## 2013-04-15 ENCOUNTER — Ambulatory Visit (INDEPENDENT_AMBULATORY_CARE_PROVIDER_SITE_OTHER): Payer: PRIVATE HEALTH INSURANCE | Admitting: Nurse Practitioner

## 2013-04-15 ENCOUNTER — Encounter: Payer: Self-pay | Admitting: Nurse Practitioner

## 2013-04-15 VITALS — BP 128/77 | HR 54 | Temp 96.7°F | Ht 63.0 in | Wt 148.0 lb

## 2013-04-15 DIAGNOSIS — R350 Frequency of micturition: Secondary | ICD-10-CM

## 2013-04-15 DIAGNOSIS — F0390 Unspecified dementia without behavioral disturbance: Secondary | ICD-10-CM

## 2013-04-15 LAB — POCT URINALYSIS DIPSTICK
Bilirubin, UA: NEGATIVE
Blood, UA: NEGATIVE
Glucose, UA: NEGATIVE
Leukocytes, UA: NEGATIVE
Nitrite, UA: NEGATIVE

## 2013-04-15 LAB — POCT UA - MICROSCOPIC ONLY
Bacteria, U Microscopic: NEGATIVE
RBC, urine, microscopic: NEGATIVE

## 2013-04-15 MED ORDER — DONEPEZIL HCL 10 MG PO TABS: 10.0000 mg | ORAL_TABLET | Freq: Every evening | ORAL | Status: DC | PRN

## 2013-04-15 NOTE — Patient Instructions (Signed)
Dementia Dementia is a general term for problems with brain function. A person with dementia has memory loss and a hard time with at least one other brain function such as thinking, speaking, or problem solving. Dementia can affect social functioning, how you do your job, your mood, or your personality. The changes may be hidden for a long time. The earliest forms of this disease are usually not detected by family or friends. Dementia can be:  Irreversible.  Potentially reversible.  Partially reversible.  Progressive. This means it can get worse over time. CAUSES  Irreversible dementia causes may include:  Degeneration of brain cells (Alzheimer's disease or lewy body dementia).  Multiple small strokes (vascular dementia).  Infection (chronic meningitis or Creutzfelt-Jakob disease).  Frontotemporal dementia. This affects younger people, age 40 to 70, compared to those who have Alzheimer's disease.  Dementia associated with other disorders like Parkinson's disease, Huntington's disease, or HIV-associated dementia. Potentially or partially reversible dementia causes may include:  Medicines.  Metabolic causes such as excessive alcohol intake, vitamin B12 deficiency, or thyroid disease.  Masses or pressure in the brain such as a tumor, blood clot, or hydrocephalus. SYMPTOMS  Symptoms are often hard to detect. Family members or coworkers may not notice them early in the disease process. Different people with dementia may have different symptoms. Symptoms can include:  A hard time with memory, especially recent memory. Long-term memory may not be impaired.  Asking the same question multiple times or forgetting something someone just said.  A hard time speaking your thoughts or finding certain words.  A hard time solving problems or performing familiar tasks (such as how to use a telephone).  Sudden changes in mood.  Changes in personality, especially increasing moodiness or  mistrust.  Depression.  A hard time understanding complex ideas that were never a problem in the past. DIAGNOSIS  There are no specific tests for dementia.   Your caregiver may recommend a thorough evaluation. This is because some forms of dementia can be reversible. The evaluation will likely include a physical exam and getting a detailed history from you and a family member. The history often gives the best clues and suggestions for a diagnosis.  Memory testing may be done. A detailed brain function evaluation called neuropsychologic testing may be helpful.  Lab tests and brain imaging (such as a CT scan or MRI scan) are sometimes important.  Sometimes observation and re-evaluation over time is very helpful. TREATMENT  Treatment depends on the cause.   If the problem is a vitamin deficiency, it may be helped or cured with supplements.  For dementias such as Alzheimer's disease, medicines are available to stabilize or slow the course of the disease. There are no cures for this type of dementia.  Your caregiver can help direct you to groups, organizations, and other caregivers to help with decisions in the care of you or your loved one. HOME CARE INSTRUCTIONS The care of individuals with dementia is varied and dependent upon the progression of the dementia. The following suggestions are intended for the person living with, or caring for, the person with dementia.  Create a safe environment.  Remove the locks on bathroom doors to prevent the person from accidentally locking himself or herself in.  Use childproof latches on kitchen cabinets and any place where cleaning supplies, chemicals, or alcohol are kept.  Use childproof covers in unused electrical outlets.  Install childproof devices to keep doors and windows secured.  Remove stove knobs or install safety   knobs and an automatic shut-off on the stove.  Lower the temperature on water heaters.  Label medicines and keep them  locked up.  Secure knives, lighters, matches, power tools, and guns, and keep these items out of reach.  Keep the house free from clutter. Remove rugs or anything that might contribute to a fall.  Remove objects that might break and hurt the person.  Make sure lighting is good, both inside and outside.  Install grab rails as needed.  Use a monitoring device to alert you to falls or other needs for help.  Reduce confusion.  Keep familiar objects and people around.  Use night lights or dim lights at night.  Label items or areas.  Use reminders, notes, or directions for daily activities or tasks.  Keep a simple, consistent routine for waking, meals, bathing, dressing, and bedtime.  Create a calm, quiet environment.  Place large clocks and calendars prominently.  Display emergency numbers and home address near all telephones.  Use cues to establish different times of the day. An example is to open curtains to let the natural light in during the day.   Use effective communication.  Choose simple words and short sentences.  Use a gentle, calm tone of voice.  Be careful not to interrupt.  If the person is struggling to find a word or communicate a thought, try to provide the word or thought.  Ask one question at a time. Allow the person ample time to answer questions. Repeat the question again if the person does not respond.  Reduce nighttime restlessness.  Provide a comfortable bed.  Have a consistent nighttime routine.  Ensure a regular walking or physical activity schedule. Involve the person in daily activities as much as possible.  Limit napping during the day.  Limit caffeine.  Attend social events that stimulate rather than overwhelm the senses.  Encourage good nutrition and hydration.  Reduce distractions during meal times and snacks.  Avoid foods that are too hot or too cold.  Monitor chewing and swallowing ability.  Continue with routine vision,  hearing, dental, and medical screenings.  Only give over-the-counter or prescription medicines as directed by the caregiver.  Monitor driving abilities. Do not allow the person to drive when safe driving is no longer possible.  Register with an identification program which could provide location assistance in the event of a missing person situation. SEEK MEDICAL CARE IF:   New behavioral problems start such as moodiness, aggressiveness, or seeing things that are not there (hallucinations).  Any new problem with brain function happens. This includes problems with balance, speech, or falling a lot.  Problems with swallowing develop.  Any symptoms of other illness happen. Small changes or worsening in any aspect of brain function can be a sign that the illness is getting worse. It can also be a sign of another medical illness such as infection. Seeing a caregiver right away is important. SEEK IMMEDIATE MEDICAL CARE IF:   A fever develops.  New or worsened confusion develops.  New or worsened sleepiness develops.  Staying awake becomes hard to do. Document Released: 04/19/2001 Document Revised: 01/16/2012 Document Reviewed: 03/21/2011 Exit[employer] Patient Information 2014 [employer].  

## 2013-04-15 NOTE — Progress Notes (Signed)
  Subjective:    Patient ID: Haley Wang, female    DOB: 1941/08/19, 72 y.o.   MRN: [id number]  HPI 1. Memory problems- Daughter brings mom in stating that she is becoming very forgetful. Looses things frequently. Daughter says that she repeats herself a lot. Forgets that her parents are dead an dthat husband is dead. Sometimes doesn't recognize her daughter who livs with her. 2. Urinary frequency- Has been going on for awhile- Nocturia 1X nightly.    Review of Systems  Constitutional: Negative.   HENT: Negative.   Respiratory: Negative.   Cardiovascular: Negative.   Genitourinary: Negative for dysuria and enuresis.  Psychiatric/Behavioral: Positive for altered mental status.       Objective:   Physical Exam  Constitutional: She appears well-developed and well-nourished.  Cardiovascular: Normal rate and normal heart sounds.   Pulmonary/Chest: Effort normal and breath sounds normal.  Abdominal: Soft. Bowel sounds are normal. She exhibits no mass. There is no tenderness. There is no guarding.  Neurological: She is alert. She has normal reflexes. No cranial nerve deficit.  Skin: Skin is warm.    BP 128/77  Pulse 54  Temp(Src) 96.7 F (35.9 C) (Oral)  Ht 5\' 3"  (1.6 m)  Wt 148 lb (67.132 kg)  BMI 26.22 kg/m2       Assessment & Plan:  1. Frequent urination Negative for UTI - POCT urinalysis dipstick - POCT UA - Microscopic Only  2. Dementia, without behavioral disturbance Puzzle books to stimulate mind Meds ordered this encounter  Medications  . donepezil (ARICEPT) 10 MG tablet    Sig: Take 1 tablet (10 mg total) by mouth at bedtime as needed.    Dispense:  30 tablet    Refill:  3    Order Specific Question:  Supervising Provider    Answer:  Ernestina Penna [[id number]]   Follow- up in 1 month Mary-Margaret Daphine Deutscher, FNP

## 2013-04-18 ENCOUNTER — Other Ambulatory Visit: Payer: Self-pay | Admitting: *Deleted

## 2013-04-18 MED ORDER — CLONAZEPAM 0.5 MG PO TABS: 0.5000 mg | ORAL_TABLET | Freq: Two times a day (BID) | ORAL | Status: DC | PRN

## 2013-04-18 NOTE — Telephone Encounter (Signed)
Last filled 03/21/13, last seen 04/15/13, call into [facility] [phone]

## 2013-04-18 NOTE — Telephone Encounter (Signed)
Please call in RX for alprazolam with 2 refills

## 2013-04-19 NOTE — Telephone Encounter (Signed)
Klonipin called to [facility].

## 2013-04-22 ENCOUNTER — Other Ambulatory Visit: Payer: Self-pay | Admitting: *Deleted

## 2013-04-22 MED ORDER — ATORVASTATIN CALCIUM 10 MG PO TABS: 10.0000 mg | ORAL_TABLET | Freq: Every day | ORAL | Status: DC

## 2013-05-17 ENCOUNTER — Ambulatory Visit: Payer: PRIVATE HEALTH INSURANCE | Admitting: Nurse Practitioner

## 2013-05-22 ENCOUNTER — Other Ambulatory Visit: Payer: Self-pay

## 2013-05-22 DIAGNOSIS — F418 Other specified anxiety disorders: Secondary | ICD-10-CM

## 2013-05-22 DIAGNOSIS — F329 Major depressive disorder, single episode, unspecified: Secondary | ICD-10-CM

## 2013-05-22 MED ORDER — VENLAFAXINE HCL ER 75 MG PO CP24: 150.0000 mg | ORAL_CAPSULE | Freq: Every day | ORAL | Status: DC

## 2013-05-22 MED ORDER — QUETIAPINE FUMARATE 50 MG PO TABS: 50.0000 mg | ORAL_TABLET | Freq: Every day | ORAL | Status: DC

## 2013-06-14 ENCOUNTER — Other Ambulatory Visit: Payer: Self-pay | Admitting: Nurse Practitioner

## 2013-06-14 DIAGNOSIS — F329 Major depressive disorder, single episode, unspecified: Secondary | ICD-10-CM

## 2013-06-14 MED ORDER — QUETIAPINE FUMARATE ER 50 MG PO TB24: ORAL_TABLET | ORAL | Status: DC

## 2013-06-21 ENCOUNTER — Other Ambulatory Visit: Payer: Self-pay

## 2013-06-21 DIAGNOSIS — K5900 Constipation, unspecified: Secondary | ICD-10-CM

## 2013-06-21 MED ORDER — POLYETHYLENE GLYCOL 3350 17 GM/SCOOP PO POWD: 17.0000 g | Freq: Two times a day (BID) | ORAL | Status: DC | PRN

## 2013-07-16 ENCOUNTER — Other Ambulatory Visit: Payer: Self-pay | Admitting: Nurse Practitioner

## 2013-07-16 ENCOUNTER — Other Ambulatory Visit: Payer: Self-pay

## 2013-07-16 MED ORDER — CLONAZEPAM 0.5 MG PO TABS: 0.5000 mg | ORAL_TABLET | Freq: Two times a day (BID) | ORAL | Status: DC | PRN

## 2013-07-16 NOTE — Telephone Encounter (Signed)
Please call in klonopin rx with 1 refill 

## 2013-07-16 NOTE — Telephone Encounter (Signed)
Lat seen 04/15/13   MMM  If approved route to nurse to phone in

## 2013-07-16 NOTE — Telephone Encounter (Signed)
Called into the pharmacy.

## 2013-07-16 NOTE — Telephone Encounter (Signed)
Last seen 04/15/13  MMM

## 2013-08-08 ENCOUNTER — Other Ambulatory Visit: Payer: Self-pay | Admitting: Nurse Practitioner

## 2013-08-12 NOTE — Telephone Encounter (Signed)
Last seen 04/15/13  MMM  Last lipids 07/03/12

## 2013-09-04 ENCOUNTER — Other Ambulatory Visit: Payer: Self-pay | Admitting: Nurse Practitioner

## 2013-09-05 NOTE — Telephone Encounter (Signed)
Last seen 04/15/13  MMM  Last lipid 02/14/13

## 2013-10-01 ENCOUNTER — Other Ambulatory Visit: Payer: Self-pay | Admitting: Nurse Practitioner

## 2013-10-04 NOTE — Telephone Encounter (Signed)
Last seen 05/14, uses [facility] [phone]

## 2013-10-07 NOTE — Telephone Encounter (Signed)
Please call in klonopin rx

## 2013-10-07 NOTE — Telephone Encounter (Signed)
Called in.

## 2013-10-28 ENCOUNTER — Other Ambulatory Visit: Payer: Self-pay | Admitting: Nurse Practitioner

## 2013-10-29 ENCOUNTER — Other Ambulatory Visit: Payer: Self-pay

## 2013-10-29 MED ORDER — LEVOTHYROXINE SODIUM 75 MCG PO TABS: 75.0000 ug | ORAL_TABLET | Freq: Every day | ORAL | Status: DC

## 2013-10-29 NOTE — Telephone Encounter (Signed)
Please call in ativan 

## 2013-10-30 ENCOUNTER — Other Ambulatory Visit: Payer: Self-pay | Admitting: Nurse Practitioner

## 2013-11-04 NOTE — Telephone Encounter (Signed)
Klonopin called in

## 2013-11-27 ENCOUNTER — Other Ambulatory Visit: Payer: Self-pay | Admitting: Nurse Practitioner

## 2013-12-02 ENCOUNTER — Telehealth: Payer: Self-pay | Admitting: *Deleted

## 2013-12-02 NOTE — Telephone Encounter (Signed)
ntbs

## 2013-12-02 NOTE — Telephone Encounter (Signed)
Aware,NTBS. 

## 2013-12-02 NOTE — Telephone Encounter (Signed)
last seen 04/15/13

## 2013-12-17 ENCOUNTER — Ambulatory Visit (INDEPENDENT_AMBULATORY_CARE_PROVIDER_SITE_OTHER): Payer: PRIVATE HEALTH INSURANCE

## 2013-12-17 ENCOUNTER — Telehealth: Payer: Self-pay | Admitting: General Practice

## 2013-12-17 ENCOUNTER — Encounter: Payer: Self-pay | Admitting: General Practice

## 2013-12-17 ENCOUNTER — Ambulatory Visit (INDEPENDENT_AMBULATORY_CARE_PROVIDER_SITE_OTHER): Payer: PRIVATE HEALTH INSURANCE | Admitting: General Practice

## 2013-12-17 VITALS — BP 142/78 | HR 64 | Temp 97.4°F | Ht 63.0 in | Wt 158.0 lb

## 2013-12-17 DIAGNOSIS — K5909 Other constipation: Secondary | ICD-10-CM

## 2013-12-17 DIAGNOSIS — M545 Low back pain, unspecified: Secondary | ICD-10-CM

## 2013-12-17 DIAGNOSIS — Z23 Encounter for immunization: Secondary | ICD-10-CM

## 2013-12-17 DIAGNOSIS — K5900 Constipation, unspecified: Secondary | ICD-10-CM

## 2013-12-17 MED ORDER — METHYLPREDNISOLONE ACETATE 80 MG/ML IJ SUSP
80.0000 mg | Freq: Once | INTRAMUSCULAR | Status: AC
  Administered 2013-12-17: 80 mg via INTRAMUSCULAR

## 2013-12-17 MED ORDER — POLYETHYLENE GLYCOL 3350 17 GM/SCOOP PO POWD: 17.0000 g | Freq: Every day | ORAL | Status: DC

## 2013-12-17 NOTE — Patient Instructions (Addendum)
Back Pain, Adult °Low back pain is very common. About 1 in 5 people have back pain. The cause of low back pain is rarely dangerous. The pain often gets better over time. About half of people with a sudden onset of back pain feel better in just 2 weeks. About 8 in 10 people feel better by 6 weeks.  °CAUSES °Some common causes of back pain include: °· Strain of the muscles or ligaments supporting the spine. °· Wear and tear (degeneration) of the spinal discs. °· Arthritis. °· Direct injury to the back. °DIAGNOSIS °Most of the time, the direct cause of low back pain is not known. However, back pain can be treated effectively even when the exact cause of the pain is unknown. Answering your caregiver's questions about your overall health and symptoms is one of the most accurate ways to make sure the cause of your pain is not dangerous. If your caregiver needs more information, he or she may order lab work or imaging tests (X-rays or MRIs). However, even if imaging tests show changes in your back, this usually does not require surgery. °HOME CARE INSTRUCTIONS °For many people, back pain returns. Since low back pain is rarely dangerous, it is often a condition that people can learn to manage on their own.  °· Remain active. It is stressful on the back to sit or stand in one place. Do not sit, drive, or stand in one place for more than 30 minutes at a time. Take short walks on level surfaces as soon as pain allows. Try to increase the length of time you walk each day. °· Do not stay in bed. Resting more than 1 or 2 days can delay your recovery. °· Do not avoid exercise or work. Your body is made to move. It is not dangerous to be active, even though your back may hurt. Your back will likely heal faster if you return to being active before your pain is gone. °· Pay attention to your body when you  bend and lift. Many people have less discomfort when lifting if they bend their knees, keep the load close to their bodies, and  avoid twisting. Often, the most comfortable positions are those that put less stress on your recovering back. °· Find a comfortable position to sleep. Use a firm mattress and lie on your side with your knees slightly bent. If you lie on your back, put a pillow under your knees. °· Only take over-the-counter or prescription medicines as directed by your caregiver. Over-the-counter medicines to reduce pain and inflammation are often the most helpful. Your caregiver may prescribe muscle relaxant drugs. These medicines help dull your pain so you can more quickly return to your normal activities and healthy exercise. °· Put ice on the injured area. °· Put ice in a plastic bag. °· Place a towel between your skin and the bag. °· Leave the ice on for 15-20 minutes, 03-04 times a day for the first 2 to 3 days. After that, ice and heat may be alternated to reduce pain and spasms. °· Ask your caregiver about trying back exercises and gentle massage. This may be of some benefit. °· Avoid feeling anxious or stressed. Stress increases muscle tension and can worsen back pain. It is important to recognize when you are anxious or stressed and learn ways to manage it. Exercise is a great option. °SEEK MEDICAL CARE IF: °· You have pain that is not relieved with rest or medicine. °· You have pain that does not improve in 1 week. °· You have new symptoms. °· You are generally not feeling well. °SEEK   IMMEDIATE MEDICAL CARE IF:  °· You have pain that radiates from your back into your legs. °· You develop new bowel or bladder control problems. °· You have unusual weakness or numbness in your arms or legs. °· You develop nausea or vomiting. °· You develop abdominal pain. °· You feel faint. °Document Released: 10/24/2005 Document Revised: 04/24/2012 Document Reviewed: 03/14/2011 °ExitCare® Patient Information ©2014 [employer]. ° °Constipation, Adult °Constipation is when a person has fewer than 3 bowel movements a week; has difficulty  having a bowel movement; or has stools that are dry, hard, or larger than normal. As people grow older, constipation is more common. If you try to fix constipation with medicines that make you have a bowel movement (laxatives), the problem may get worse. Long-term laxative use may cause the muscles of the colon to become weak. A low-fiber diet, not taking in enough fluids, and taking certain medicines may make constipation worse. °CAUSES  °· Certain medicines, such as antidepressants, pain medicine, iron supplements, antacids, and water pills.   °· Certain diseases, such as diabetes, irritable bowel syndrome (IBS), thyroid disease, or depression.   °· Not drinking enough water.   °· Not eating enough fiber-rich foods.   °· Stress or travel. °· Lack of physical activity or exercise. °· Not going to the restroom when there is the urge to have a bowel movement. °· Ignoring the urge to have a bowel movement. °· Using laxatives too much. °SYMPTOMS  °· Having fewer than 3 bowel movements a week.   °· Straining to have a bowel movement.   °· Having hard, dry, or larger than normal stools.   °· Feeling full or bloated.   °· Pain in the lower abdomen. °· Not feeling relief after having a bowel movement. °DIAGNOSIS  °Your caregiver will take a medical history and perform a physical exam. Further testing may be done for severe constipation. Some tests may include:  °· A barium enema X-ray to examine your rectum, colon, and sometimes, your small intestine. °· A sigmoidoscopy to examine your lower colon. °· A colonoscopy to examine your entire colon. °TREATMENT  °Treatment will depend on the severity of your constipation and what is causing it. Some dietary treatments include drinking more fluids and eating more fiber-rich foods. Lifestyle treatments may include regular exercise. If these diet and lifestyle recommendations do not help, your caregiver may recommend taking over-the-counter laxative medicines to help you have bowel  movements. Prescription medicines may be prescribed if over-the-counter medicines do not work.  °HOME CARE INSTRUCTIONS  °· Increase dietary fiber in your diet, such as fruits, vegetables, whole grains, and beans. Limit high-fat and processed sugars in your diet, such as French fries, hamburgers, cookies, candies, and soda.   °· A fiber supplement may be added to your diet if you cannot get enough fiber from foods.   °· Drink enough fluids to keep your urine clear or pale yellow.   °· Exercise regularly or as directed by your caregiver.   °· Go to the restroom when you have the urge to go. Do not hold it. °· Only take medicines as directed by your caregiver. Do not take other medicines for constipation without talking to your caregiver first. °SEEK IMMEDIATE MEDICAL CARE IF:  °· You have bright red blood in your stool.   °· Your constipation lasts for more than 4 days or gets worse.   °· You have abdominal or rectal pain.   °· You have thin, pencil-like stools. °· You have unexplained weight loss. °MAKE SURE YOU:  °· Understand these instructions. °·   Will watch your condition. °· Will get help right away if you are not doing well or get worse. °Document Released: 07/22/2004 Document Revised: 01/16/2012 Document Reviewed: 08/05/2013 °ExitCare® Patient Information ©2014 [employer]. ° °

## 2013-12-17 NOTE — Progress Notes (Signed)
   Subjective:    Patient ID: Haley Wang, female    DOB: Jul 19, 1941, 73 y.o.   MRN: [id number]  Back Pain This is a new problem. The current episode started more than 1 month ago (6 months ago). The problem occurs daily. The problem is unchanged. The pain is present in the lumbar spine. The quality of the pain is described as aching. The pain radiates to the left thigh and right thigh. The pain is worse during the day. The symptoms are aggravated by bending and lying down. Pertinent negatives include no bladder incontinence, bowel incontinence, chest pain, fever, leg pain, numbness or weakness. The treatment provided mild relief.      Review of Systems  Constitutional: Negative for fever and chills.  Respiratory: Negative for chest tightness and shortness of breath.   Cardiovascular: Negative for chest pain and palpitations.  Gastrointestinal: Negative for bowel incontinence.  Genitourinary: Negative for bladder incontinence.  Musculoskeletal: Positive for back pain.  Neurological: Negative for weakness and numbness.       Objective:   Physical Exam  Constitutional: She appears well-developed and well-nourished.  Cardiovascular: Normal rate, regular rhythm and normal heart sounds.   Pulmonary/Chest: Effort normal and breath sounds normal. No respiratory distress. She exhibits no tenderness.  Musculoskeletal: She exhibits tenderness.  Lumbar tenderness with palpation  Neurological: She is alert.  Skin: Skin is warm and dry.  Psychiatric: She has a normal mood and affect.     [facility] reading (PRIMARY) by Coralie KeensMae E. Azeem Poorman, FNP-C, joint space narrowing and moderate amount of stool noted.       Assessment & Plan:  1. Low back pain  - DG Lumbar Spine Complete; Future - methylPREDNISolone acetate (DEPO-MEDROL) injection 80 mg; Inject 1 mL (80 mg total) into the muscle once.  2. Chronic constipation  - polyethylene glycol powder (GLYCOLAX/MIRALAX) powder; Take 17 g by mouth  daily.  Dispense: 850 g; Refill: 2  3. Need for prophylactic vaccination and inoculation against influenza -RTO if symptoms worsen or no improvement -Patient and daughter verbalized understanding Coralie KeensMae E. Teisha Trowbridge, FNP-C

## 2013-12-18 NOTE — Telephone Encounter (Signed)
appt made for mae per Aram Beechamcynthia

## 2013-12-24 ENCOUNTER — Ambulatory Visit: Payer: PRIVATE HEALTH INSURANCE | Admitting: General Practice

## 2013-12-26 ENCOUNTER — Other Ambulatory Visit: Payer: Self-pay | Admitting: Nurse Practitioner

## 2013-12-30 NOTE — Telephone Encounter (Signed)
Patient last seen in office on 12-17-13. Klonopin last filled on 10-28-13. Please advise. If approved please route to Pool B so nurse can phone in to pharmacy

## 2013-12-31 ENCOUNTER — Ambulatory Visit: Payer: PRIVATE HEALTH INSURANCE | Admitting: General Practice

## 2013-12-31 NOTE — Telephone Encounter (Signed)
Please call in klonopin with 1 refills 

## 2014-01-01 ENCOUNTER — Telehealth: Payer: Self-pay | Admitting: Nurse Practitioner

## 2014-01-01 NOTE — Telephone Encounter (Signed)
rx called into pharmacy

## 2014-01-03 NOTE — Telephone Encounter (Signed)
Already called in per the paharmacy

## 2014-02-05 ENCOUNTER — Telehealth: Payer: Self-pay | Admitting: General Practice

## 2014-02-05 NOTE — Telephone Encounter (Signed)
appt made for tues

## 2014-02-11 ENCOUNTER — Ambulatory Visit (INDEPENDENT_AMBULATORY_CARE_PROVIDER_SITE_OTHER): Payer: Medicare PPO | Admitting: General Practice

## 2014-02-11 ENCOUNTER — Encounter: Payer: Self-pay | Admitting: General Practice

## 2014-02-11 VITALS — BP 111/69 | HR 56 | Temp 97.5°F | Ht 63.0 in | Wt 151.8 lb

## 2014-02-11 DIAGNOSIS — M1990 Unspecified osteoarthritis, unspecified site: Secondary | ICD-10-CM

## 2014-02-11 DIAGNOSIS — M545 Low back pain, unspecified: Secondary | ICD-10-CM

## 2014-02-11 DIAGNOSIS — M129 Arthropathy, unspecified: Secondary | ICD-10-CM

## 2014-02-11 DIAGNOSIS — IMO0002 Reserved for concepts with insufficient information to code with codable children: Secondary | ICD-10-CM

## 2014-02-11 NOTE — Progress Notes (Signed)
   Subjective:    Patient ID: Haley HaroldNancy L Wang, female    DOB: 1941/04/17, 73 y.o.   MRN: [id number]  HPI Patient presents today for follow up of back pain. She is accompanied by family member. Reports taking tylenol arthritis, which is effective in reducing pain at times. Rates pain as 5 of 10 and gradually increasing. Denies known injury. Lumbar spine xray completed on 12/17/13. Patient has dementia, but able to answer question appropriately.     Review of Systems  Constitutional: Negative for fever and chills.  Respiratory: Negative for chest tightness and shortness of breath.   Cardiovascular: Negative for chest pain and palpitations.  Gastrointestinal: Negative for bowel incontinence.  Genitourinary: Negative for bladder incontinence.  Musculoskeletal: Positive for back pain.  Neurological: Negative for weakness and numbness.       Objective:   Physical Exam  Constitutional: She appears well-developed and well-nourished.  Cardiovascular: Normal rate, regular rhythm and normal heart sounds.   Pulmonary/Chest: Effort normal and breath sounds normal. No respiratory distress. She exhibits no tenderness.  Musculoskeletal:  Negative lumbar tenderness with palpation  Neurological: She is alert.  Skin: Skin is warm and dry.  Psychiatric: She has a normal mood and affect.          Assessment & Plan:  1. Arthritis   2. Low back pain,  3. Degenerative disc disease  - Ambulatory referral to Orthopedic Surgery -patient information provided and discussed back pain -RTO prn -may seek emergency medical treatment as needed Patient and family member verbalized understanding Coralie KeensMae E. Casimer Russett, FNP-C

## 2014-02-11 NOTE — Patient Instructions (Signed)
Back Pain, Adult Low back pain is very common. About 1 in 5 people have back pain.The cause of low back pain is rarely dangerous. The pain often gets better over time.About half of people with a sudden onset of back pain feel better in just 2 weeks. About 8 in 10 people feel better by 6 weeks.  CAUSES Some common causes of back pain include:  Strain of the muscles or ligaments supporting the spine.  Wear and tear (degeneration) of the spinal discs.  Arthritis.  Direct injury to the back. DIAGNOSIS Most of the time, the direct cause of low back pain is not known.However, back pain can be treated effectively even when the exact cause of the pain is unknown.Answering your caregiver's questions about your overall health and symptoms is one of the most accurate ways to make sure the cause of your pain is not dangerous. If your caregiver needs more information, he or she may order lab work or imaging tests (X-rays or MRIs).However, even if imaging tests show changes in your back, this usually does not require surgery. HOME CARE INSTRUCTIONS For many people, back pain returns.Since low back pain is rarely dangerous, it is often a condition that people can learn to manageon their own.   Remain active. It is stressful on the back to sit or stand in one place. Do not sit, drive, or stand in one place for more than 30 minutes at a time. Take short walks on level surfaces as soon as pain allows.Try to increase the length of time you walk each day.  Do not stay in bed.Resting more than 1 or 2 days can delay your recovery.  Do not avoid exercise or work.Your body is made to move.It is not dangerous to be active, even though your back may hurt.Your back will likely heal faster if you return to being active before your pain is gone.  Pay attention to your body when you bend and lift. Many people have less discomfortwhen lifting if they bend their knees, keep the load close to their bodies,and  avoid twisting. Often, the most comfortable positions are those that put less stress on your recovering back.  Find a comfortable position to sleep. Use a firm mattress and lie on your side with your knees slightly bent. If you lie on your back, put a pillow under your knees.  Only take over-the-counter or prescription medicines as directed by your caregiver. Over-the-counter medicines to reduce pain and inflammation are often the most helpful.Your caregiver may prescribe muscle relaxant drugs.These medicines help dull your pain so you can more quickly return to your normal activities and healthy exercise.  Put ice on the injured area.  Put ice in a plastic bag.  Place a towel between your skin and the bag.  Leave the ice on for 15-20 minutes, 03-04 times a day for the first 2 to 3 days. After that, ice and heat may be alternated to reduce pain and spasms.  Ask your caregiver about trying back exercises and gentle massage. This may be of some benefit.  Avoid feeling anxious or stressed.Stress increases muscle tension and can worsen back pain.It is important to recognize when you are anxious or stressed and learn ways to manage it.Exercise is a great option. SEEK MEDICAL CARE IF:  You have pain that is not relieved with rest or medicine.  You have pain that does not improve in 1 week.  You have new symptoms.  You are generally not feeling well. SEEK   IMMEDIATE MEDICAL CARE IF:   You have pain that radiates from your back into your legs.  You develop new bowel or bladder control problems.  You have unusual weakness or numbness in your arms or legs.  You develop nausea or vomiting.  You develop abdominal pain.  You feel faint. Document Released: 10/24/2005 Document Revised: 04/24/2012 Document Reviewed: 03/14/2011 Exit[employer] Patient Information 2014 [employer].  

## 2014-02-12 ENCOUNTER — Encounter: Payer: Self-pay | Admitting: General Practice

## 2014-02-18 ENCOUNTER — Telehealth: Payer: Self-pay | Admitting: General Practice

## 2014-02-24 ENCOUNTER — Other Ambulatory Visit: Payer: Self-pay | Admitting: Nurse Practitioner

## 2014-02-26 NOTE — Telephone Encounter (Signed)
Last ov 4/15. Last thyroid level 4/14.Last refill on Clonazepam 02/03/14. Ntbs. If approved call in Clonazepam to [facility].

## 2014-02-27 ENCOUNTER — Other Ambulatory Visit: Payer: Self-pay | Admitting: General Practice

## 2014-02-27 NOTE — Telephone Encounter (Signed)
Please phone in also needs to be seen before next refill.

## 2014-02-27 NOTE — Telephone Encounter (Signed)
Called in.

## 2014-03-28 ENCOUNTER — Other Ambulatory Visit: Payer: Self-pay | Admitting: Nurse Practitioner

## 2014-03-28 ENCOUNTER — Other Ambulatory Visit: Payer: Self-pay | Admitting: General Practice

## 2014-04-01 NOTE — Telephone Encounter (Signed)
Please call in klonopin with 1 refills 

## 2014-04-01 NOTE — Telephone Encounter (Signed)
Last seen 02/11/14  Haley Wang  If approved route to nurse to call into [facility]  [phone]

## 2014-04-01 NOTE — Telephone Encounter (Signed)
Scripts called to [facility].

## 2014-05-20 ENCOUNTER — Other Ambulatory Visit: Payer: Self-pay | Admitting: General Practice

## 2014-05-20 NOTE — Telephone Encounter (Signed)
Last OV 4/15 with Mae.

## 2014-06-02 ENCOUNTER — Other Ambulatory Visit: Payer: Self-pay | Admitting: Nurse Practitioner

## 2014-06-03 ENCOUNTER — Telehealth: Payer: Self-pay | Admitting: Nurse Practitioner

## 2014-06-03 MED ORDER — CLONAZEPAM 0.5 MG PO TABS: ORAL_TABLET | ORAL | Status: DC

## 2014-06-03 NOTE — Telephone Encounter (Signed)
Please call in klonopin 0.5 1 po BID #[id number] with 1 refills

## 2014-06-03 NOTE — Telephone Encounter (Signed)
Called in.

## 2014-07-15 ENCOUNTER — Telehealth: Payer: Self-pay | Admitting: Family

## 2014-07-15 NOTE — Telephone Encounter (Signed)
appt given per daughters request

## 2014-07-17 ENCOUNTER — Ambulatory Visit (INDEPENDENT_AMBULATORY_CARE_PROVIDER_SITE_OTHER): Payer: Medicare PPO

## 2014-07-17 ENCOUNTER — Encounter: Payer: Self-pay | Admitting: Family Medicine

## 2014-07-17 ENCOUNTER — Ambulatory Visit (INDEPENDENT_AMBULATORY_CARE_PROVIDER_SITE_OTHER): Payer: Medicare PPO | Admitting: Family Medicine

## 2014-07-17 VITALS — BP 118/71 | HR 56 | Temp 97.2°F | Ht 63.0 in | Wt 154.8 lb

## 2014-07-17 DIAGNOSIS — R109 Unspecified abdominal pain: Secondary | ICD-10-CM

## 2014-07-17 DIAGNOSIS — M545 Low back pain, unspecified: Secondary | ICD-10-CM

## 2014-07-17 DIAGNOSIS — K5909 Other constipation: Secondary | ICD-10-CM

## 2014-07-17 DIAGNOSIS — E785 Hyperlipidemia, unspecified: Secondary | ICD-10-CM

## 2014-07-17 DIAGNOSIS — E038 Other specified hypothyroidism: Secondary | ICD-10-CM

## 2014-07-17 LAB — POCT CBC
Granulocyte percent: 44.2 %G (ref 37–80)
HCT, POC: 41.6 % (ref 37.7–47.9)
Hemoglobin: 14.3 g/dL (ref 12.2–16.2)
Lymph, poc: 2.6 (ref 0.6–3.4)
MCH, POC: 33 pg — AB (ref 27–31.2)
MCHC: 34.4 g/dL (ref 31.8–35.4)
MCV: 95.9 fL (ref 80–97)
MPV: 7.7 fL (ref 0–99.8)
POC Granulocyte: 2.2 (ref 2–6.9)
POC LYMPH PERCENT: 51.5 %L — AB (ref 10–50)
Platelet Count, POC: 179 10*3/uL (ref 142–424)
RBC: 4.3 M/uL (ref 4.04–5.48)
RDW, POC: 12.6 %
WBC: 5 10*3/uL (ref 4.6–10.2)

## 2014-07-17 MED ORDER — LACTULOSE 20 GM/30ML PO SOLN: 20.0000 g | Freq: Two times a day (BID) | ORAL | Status: DC

## 2014-07-17 MED ORDER — MELOXICAM 7.5 MG PO TABS: 7.5000 mg | ORAL_TABLET | Freq: Every day | ORAL | Status: DC

## 2014-07-17 NOTE — Progress Notes (Signed)
   Subjective:    Patient ID: Haley Wang, female    DOB: [birth year], 73 y.o.   MRN: [id number]  HPI C/o abdominal pain for 2 days.  She has not had a bm for a week.  She has been having back pain. She has hx of hypothyroidism.  She has chronic constipation and she is taking miralax and stool softeners.  She is due for labs.   Review of Systems No chest pain, SOB, HA, dizziness, vision change, N/V, diarrhea, constipation, dysuria, urinary urgency or frequency, myalgias, arthralgias or rash.     Objective:   Physical Exam Vital signs noted  Well developed well nourished female.  HEENT - Head atraumatic Normocephalic                Eyes - PERRLA, Conjuctiva - clear Sclera- Clear EOMI                Ears - EAC's Wnl TM's Wnl Gross Hearing WNL                Nose - Nares patent                 Throat - oropharanx wnl Respiratory - Lungs CTA bilateral Cardiac - RRR S1 and S2 without murmur GI - Abdomen soft tender lower abdomen and bowel sounds active x 4 Extremities - No edema. Neuro - Grossly intact.  KUB - constipation Prelimnary reading by Iverson Alamin     Assessment & Plan:  Other constipation - Plan: DG Abd 1 View, Lactulose 20 GM/30ML SOLN  Abdominal pain, unspecified site - Plan: DG Abd 1 View, POCT CBC, Lactulose 20 GM/30ML SOLN  Hyperlipemia - Plan: CMP14+EGFR, Lipid panel  Other specified hypothyroidism - Plan: Thyroid Panel With TSH  Bilateral low back pain without sciatica - Plan: meloxicam (MOBIC) 7.5 MG tablet  Lysbeth Penner FNP

## 2014-07-18 LAB — CMP14+EGFR
ALT: 10 IU/L (ref 0–32)
AST: 21 IU/L (ref 0–40)
Albumin/Globulin Ratio: 2.3 (ref 1.1–2.5)
Albumin: 4.3 g/dL (ref 3.5–4.8)
Alkaline Phosphatase: 85 IU/L (ref 39–117)
BUN/Creatinine Ratio: 9 — ABNORMAL LOW (ref 11–26)
BUN: 9 mg/dL (ref 8–27)
CO2: 25 mmol/L (ref 18–29)
Calcium: 10.3 mg/dL (ref 8.7–10.3)
Chloride: 101 mmol/L (ref 97–108)
Creatinine, Ser: 0.96 mg/dL (ref 0.57–1.00)
GFR calc Af Amer: 67 mL/min/{1.73_m2} (ref >59)
GFR calc non Af Amer: 58 mL/min/{1.73_m2} — ABNORMAL LOW (ref >59)
Globulin, Total: 1.9 g/dL (ref 1.5–4.5)
Glucose: 79 mg/dL (ref 65–99)
Potassium: 4.3 mmol/L (ref 3.5–5.2)
Sodium: 140 mmol/L (ref 134–144)
Total Bilirubin: 0.5 mg/dL (ref 0.0–1.2)
Total Protein: 6.2 g/dL (ref 6.0–8.5)

## 2014-07-18 LAB — THYROID PANEL WITH TSH
Free Thyroxine Index: 1.9 (ref 1.2–4.9)
T3 Uptake Ratio: 28 % (ref 24–39)
T4, Total: 6.7 ug/dL (ref 4.5–12.0)
TSH: 0.68 u[IU]/mL (ref 0.450–4.500)

## 2014-07-18 LAB — LIPID PANEL
Chol/HDL Ratio: 2.7 ratio units (ref 0.0–4.4)
Cholesterol, Total: 164 mg/dL (ref 100–199)
HDL: 60 mg/dL (ref >39)
LDL Calculated: 84 mg/dL (ref 0–99)
Triglycerides: 102 mg/dL (ref 0–149)
VLDL Cholesterol Cal: 20 mg/dL (ref 5–40)

## 2014-07-29 ENCOUNTER — Telehealth: Payer: Self-pay | Admitting: Family Medicine

## 2014-07-29 NOTE — Telephone Encounter (Signed)
Daughter aware of labs

## 2014-08-01 ENCOUNTER — Other Ambulatory Visit: Payer: Self-pay | Admitting: Nurse Practitioner

## 2014-08-04 ENCOUNTER — Other Ambulatory Visit: Payer: Self-pay | Admitting: Family Medicine

## 2014-08-04 NOTE — Telephone Encounter (Signed)
Last seen 07/17/14, last filled 07/04/14. Route to pool, nurse call into [facility] [phone]

## 2014-08-05 ENCOUNTER — Other Ambulatory Visit: Payer: Self-pay | Admitting: Family

## 2014-08-05 ENCOUNTER — Telehealth: Payer: Self-pay | Admitting: Nurse Practitioner

## 2014-08-05 MED ORDER — CLONAZEPAM 0.5 MG PO TABS: ORAL_TABLET | ORAL | Status: DC

## 2014-08-05 NOTE — Telephone Encounter (Signed)
Pharmacy needs today, last filled 07/04/14, last seen 07/17/14. Route to pool, nurse call into [facility] [phone]

## 2014-08-18 ENCOUNTER — Telehealth: Payer: Self-pay | Admitting: Nurse Practitioner

## 2014-08-18 NOTE — Telephone Encounter (Signed)
NTBS seen to discuss

## 2014-08-18 NOTE — Telephone Encounter (Signed)
Spoke with pt's daughter regarding meds for sleep She is currently taking Klonopin and Seroquel but still is not sleeping Informed that she would need to come in to discuss

## 2014-08-22 ENCOUNTER — Telehealth: Payer: Self-pay | Admitting: Nurse Practitioner

## 2014-08-22 NOTE — Telephone Encounter (Signed)
Appointment scheduled for Monday with Paulene FloorMary Martin

## 2014-08-25 ENCOUNTER — Ambulatory Visit (INDEPENDENT_AMBULATORY_CARE_PROVIDER_SITE_OTHER): Payer: Medicare PPO | Admitting: Nurse Practitioner

## 2014-08-25 ENCOUNTER — Encounter: Payer: Self-pay | Admitting: Nurse Practitioner

## 2014-08-25 VITALS — BP 99/53 | HR 55 | Temp 97.0°F | Ht 63.0 in | Wt 161.0 lb

## 2014-08-25 DIAGNOSIS — Z23 Encounter for immunization: Secondary | ICD-10-CM

## 2014-08-25 DIAGNOSIS — F0390 Unspecified dementia without behavioral disturbance: Secondary | ICD-10-CM

## 2014-08-25 DIAGNOSIS — F418 Other specified anxiety disorders: Secondary | ICD-10-CM

## 2014-08-25 NOTE — Patient Instructions (Signed)

## 2014-08-25 NOTE — Progress Notes (Signed)
   Subjective:    Patient ID: Genia HaroldNancy L Westley, female    DOB: 1941/07/15, 73 y.o.   MRN: [id number]  HPI Patient brought in by caregiver- They say that she has recently moved out of her home and is living with a caregiver now for 1 week- SHe has been a little moody and tearful, and having trouble sleeping- Her dementia seems to be holding steady and she is on aricept.    Review of Systems  Constitutional: Negative.   HENT: Negative.   Respiratory: Negative.   Cardiovascular: Negative.   Genitourinary: Negative.   Neurological: Negative.   Psychiatric/Behavioral: Negative.   All other systems reviewed and are negative.      Objective:   Physical Exam  Constitutional: She is oriented to person, place, and time. She appears well-developed and well-nourished. She appears distressed.  Cardiovascular: Normal rate, regular rhythm and normal heart sounds.   Pulmonary/Chest: Effort normal and breath sounds normal.  Neurological: She is alert and oriented to person, place, and time.  Skin: Skin is warm.  Psychiatric: She has a normal mood and affect. Her behavior is normal. Judgment and thought content normal.   BP 99/53  Pulse 55  Temp(Src) 97 F (36.1 C) (Oral)  Ht 5\' 3"  (1.6 m)  Wt 161 lb (73.029 kg)  BMI 28.53 kg/m2        Assessment & Plan:   1. Depression with anxiety   2. Dementia, without behavioral disturbance    Talked with caregiver about patient just needing time to adjust to new surroundings- do not want to give her anything that will make her sleepy or groggy especially since she is in new surroundings. That would increase her risk for fall or fracture. Told caregiver that she just needs time to adjust to her new surrondings. Fall precautions  Mary-Margaret Daphine DeutscherMartin, FNP

## 2014-08-26 ENCOUNTER — Ambulatory Visit: Payer: Medicaid Other

## 2014-09-03 ENCOUNTER — Other Ambulatory Visit: Payer: Self-pay | Admitting: Nurse Practitioner

## 2014-09-10 ENCOUNTER — Telehealth: Payer: Self-pay | Admitting: Nurse Practitioner

## 2014-09-10 DIAGNOSIS — F0390 Unspecified dementia without behavioral disturbance: Secondary | ICD-10-CM

## 2014-09-11 NOTE — Telephone Encounter (Signed)
Left message on machine that referral has been ordered and they will contact her with appt, if she has not heard from them within 10 days to contact us back

## 2014-09-11 NOTE — Telephone Encounter (Signed)
Referral made- can take awhile to get appointment

## 2014-10-07 ENCOUNTER — Telehealth: Payer: Self-pay | Admitting: Family Medicine

## 2014-10-07 NOTE — Telephone Encounter (Signed)
Appointment given for 12/9 with Paulene Floormary Martin, FNP and daughter advised to take her to urgent care today for the UTI.

## 2014-10-15 ENCOUNTER — Encounter: Payer: Self-pay | Admitting: Nurse Practitioner

## 2014-10-15 ENCOUNTER — Ambulatory Visit (INDEPENDENT_AMBULATORY_CARE_PROVIDER_SITE_OTHER): Payer: Commercial Managed Care - HMO | Admitting: Nurse Practitioner

## 2014-10-15 VITALS — BP 132/74 | HR 63 | Temp 97.8°F | Ht 63.0 in | Wt 162.0 lb

## 2014-10-15 DIAGNOSIS — R300 Dysuria: Secondary | ICD-10-CM

## 2014-10-15 DIAGNOSIS — F32A Depression, unspecified: Secondary | ICD-10-CM

## 2014-10-15 DIAGNOSIS — F329 Major depressive disorder, single episode, unspecified: Secondary | ICD-10-CM

## 2014-10-15 DIAGNOSIS — N3000 Acute cystitis without hematuria: Secondary | ICD-10-CM

## 2014-10-15 LAB — POCT UA - MICROSCOPIC ONLY
Casts, Ur, LPF, POC: NEGATIVE
Crystals, Ur, HPF, POC: NEGATIVE
MUCUS UA: NEGATIVE
Yeast, UA: NEGATIVE

## 2014-10-15 LAB — POCT URINALYSIS DIPSTICK
BILIRUBIN UA: NEGATIVE
GLUCOSE UA: NEGATIVE
Ketones, UA: NEGATIVE
Nitrite, UA: NEGATIVE
Protein, UA: NEGATIVE
SPEC GRAV UA: 1.02
Urobilinogen, UA: NEGATIVE
pH, UA: 5

## 2014-10-15 MED ORDER — CIPROFLOXACIN HCL 500 MG PO TABS: 500.0000 mg | ORAL_TABLET | Freq: Two times a day (BID) | ORAL | Status: DC

## 2014-10-15 MED ORDER — VENLAFAXINE HCL ER 150 MG PO CP24: 150.0000 mg | ORAL_CAPSULE | Freq: Every day | ORAL | Status: DC

## 2014-10-15 NOTE — Progress Notes (Signed)
   Subjective:    Patient ID: Haley HaroldNancy L Wang, female    DOB: Sep 20, 1941, 73 y.o.   MRN: [id number]  HPI Patient in today with 2 complaints 1- currently on klonipin 0.5mg  for anxiety BID- not working- she is crying and nervous all the time. 2- dysuria- started 1 month- nocturia    Review of Systems  Constitutional: Negative.   HENT: Negative.   Respiratory: Negative.   Cardiovascular: Negative.   Gastrointestinal: Negative.   Genitourinary: Positive for dysuria, urgency and frequency.  Neurological: Negative.   Psychiatric/Behavioral: The patient is nervous/anxious.        Objective:   Physical Exam  Constitutional: She appears well-developed and well-nourished.  Neck: Normal range of motion. Neck supple.  Cardiovascular: Normal rate, regular rhythm and normal heart sounds.   Pulmonary/Chest: Effort normal and breath sounds normal.  Abdominal: Bowel sounds are normal. There is tenderness (bil lower quadreant ).  Musculoskeletal: She exhibits tenderness.  Neurological: She is alert.  Skin: Skin is warm.  Psychiatric: She has a normal mood and affect. Her behavior is normal. Judgment and thought content normal.   BP 132/74 mmHg  Pulse 63  Temp(Src) 97.8 F (36.6 C) (Oral)  Ht 5\' 3"  (1.6 m)  Wt 162 lb (73.483 kg)  BMI 28.70 kg/m2       Assessment & Plan:  1. Dysuria - POCT UA - Microscopic Only - POCT urinalysis dipstick  2. Acute cystitis without hematuria Force fluids AZO over the counter X2 days RTO prn Culture pending - ciprofloxacin (CIPRO) 500 MG tablet; Take 1 tablet (500 mg total) by mouth 2 (two) times daily.  Dispense: 6 tablet; Refill: 0  3. Depression Stress management - venlafaxine XR (EFFEXOR XR) 150 MG 24 hr capsule; Take 1 capsule (150 mg total) by mouth daily with breakfast.  Dispense: 30 capsule; Refill: 5  rto in 3 months follow up  Mary-Margaret Daphine DeutscherMartin, FNP

## 2014-10-15 NOTE — Patient Instructions (Signed)
Stress and Stress Management Stress is a normal reaction to life events. It is what you feel when life demands more than you are used to or more than you can handle. Some stress can be useful. For example, the stress reaction can help you catch the last bus of the day, study for a test, or meet a deadline at work. But stress that occurs too often or for too long can cause problems. It can affect your emotional health and interfere with relationships and normal daily activities. Too much stress can weaken your immune system and increase your risk for physical illness. If you already have a medical problem, stress can make it worse. CAUSES  All sorts of life events may cause stress. An event that causes stress for one person may not be stressful for another person. Major life events commonly cause stress. These may be positive or negative. Examples include losing your job, moving into a new home, getting married, having a baby, or losing a loved one. Less obvious life events may also cause stress, especially if they occur day after day or in combination. Examples include working long hours, driving in traffic, caring for children, being in debt, or being in a difficult relationship. SIGNS AND SYMPTOMS Stress may cause emotional symptoms including, the following:  Anxiety. This is feeling worried, afraid, on edge, overwhelmed, or out of control.  Anger. This is feeling irritated or impatient.  Depression. This is feeling sad, down, helpless, or guilty.  Difficulty focusing, remembering, or making decisions. Stress may cause physical symptoms, including the following:   Aches and pains. These may affect your head, neck, back, stomach, or other areas of your body.  Tight muscles or clenched jaw.  Low energy or trouble sleeping. Stress may cause unhealthy behaviors, including the following:   Eating to feel better (overeating) or skipping meals.  Sleeping too little, too much, or both.  Working  too much or putting off tasks (procrastination).  Smoking, drinking alcohol, or using drugs to feel better. DIAGNOSIS  Stress is diagnosed through an assessment by your health care provider. Your health care provider will ask questions about your symptoms and any stressful life events.Your health care provider will also ask about your medical history and may order blood tests or other tests. Certain medical conditions and medicine can cause physical symptoms similar to stress. Mental illness can cause emotional symptoms and unhealthy behaviors similar to stress. Your health care provider may refer you to a mental health professional for further evaluation.  TREATMENT  Stress management is the recommended treatment for stress.The goals of stress management are reducing stressful life events and coping with stress in healthy ways.  Techniques for reducing stressful life events include the following:  Stress identification. Self-monitor for stress and identify what causes stress for you. These skills may help you to avoid some stressful events.  Time management. Set your priorities, keep a calendar of events, and learn to say "no." These tools can help you avoid making too many commitments. Techniques for coping with stress include the following:  Rethinking the problem. Try to think realistically about stressful events rather than ignoring them or overreacting. Try to find the positives in a stressful situation rather than focusing on the negatives.  Exercise. Physical exercise can release both physical and emotional tension. The key is to find a form of exercise you enjoy and do it regularly.  Relaxation techniques. These relax the body and mind. Examples include yoga, meditation, tai chi, biofeedback, deep  breathing, progressive muscle relaxation, listening to music, being out in nature, journaling, and other hobbies. Again, the key is to find one or more that you enjoy and can do  regularly.  Healthy lifestyle. Eat a balanced diet, get plenty of sleep, and do not smoke. Avoid using alcohol or drugs to relax.  Strong support network. Spend time with family, friends, or other people you enjoy being around.Express your feelings and talk things over with someone you trust. Counseling or talktherapy with a mental health professional may be helpful if you are having difficulty managing stress on your own. Medicine is typically not recommended for the treatment of stress.Talk to your health care provider if you think you need medicine for symptoms of stress. HOME CARE INSTRUCTIONS  Keep all follow-up visits as directed by your health care provider.  Take all medicines as directed by your health care provider. SEEK MEDICAL CARE IF:  Your symptoms get worse or you start having new symptoms.  You feel overwhelmed by your problems and can no longer manage them on your own. SEEK IMMEDIATE MEDICAL CARE IF:  You feel like hurting yourself or someone else. Document Released: 04/19/2001 Document Revised: 03/10/2014 Document Reviewed: 06/18/2013 ExitCare Patient Information 2015 [employer]. This information is not intended to replace advice given to you by your health care provider. Make sure you discuss any questions you have with your health care provider.  

## 2014-10-16 ENCOUNTER — Telehealth: Payer: Self-pay | Admitting: Family Medicine

## 2014-10-16 DIAGNOSIS — F329 Major depressive disorder, single episode, unspecified: Secondary | ICD-10-CM

## 2014-10-16 DIAGNOSIS — F32A Depression, unspecified: Secondary | ICD-10-CM

## 2014-10-16 NOTE — Telephone Encounter (Signed)
Venlafaxine was suppose to be increased.   Script was 50 mg, take 2 AM and 1 Pm, equal 150 total daily.  You sent in script for 1 pill,  150 mg daily.   Could you increase dose?

## 2014-10-17 ENCOUNTER — Telehealth: Payer: Self-pay | Admitting: Nurse Practitioner

## 2014-10-17 DIAGNOSIS — N3000 Acute cystitis without hematuria: Secondary | ICD-10-CM

## 2014-10-17 MED ORDER — VENLAFAXINE HCL ER 150 MG PO CP24: 300.0000 mg | ORAL_CAPSULE | Freq: Every day | ORAL | Status: AC

## 2014-10-17 MED ORDER — CIPROFLOXACIN HCL 500 MG PO TABS: 500.0000 mg | ORAL_TABLET | Freq: Two times a day (BID) | ORAL | Status: DC

## 2014-10-17 NOTE — Telephone Encounter (Signed)
cipro I sent the Rx to the pharmacy.  

## 2014-10-17 NOTE — Telephone Encounter (Signed)
Stp's grand daughter, she is aware new rx sent over and to double up on the current rx so as not to waste it. They voiced understanding, will close call.

## 2014-10-17 NOTE — Telephone Encounter (Signed)
effexor increased to 300mg  daily- can double up on what they have so they will not be wasting it.

## 2014-10-30 ENCOUNTER — Other Ambulatory Visit: Payer: Self-pay | Admitting: Nurse Practitioner

## 2014-10-30 NOTE — Telephone Encounter (Signed)
Please call in klonopin with 1 refills 

## 2014-10-30 NOTE — Telephone Encounter (Signed)
[facility] today [facility]

## 2014-10-30 NOTE — Telephone Encounter (Signed)
RX called into [facility]

## 2014-10-30 NOTE — Telephone Encounter (Signed)
Last seen 10/15/14  MMM If approved route to nurse to call into [facility]  [phone]

## 2014-11-06 ENCOUNTER — Ambulatory Visit (INDEPENDENT_AMBULATORY_CARE_PROVIDER_SITE_OTHER): Payer: Medicare HMO | Admitting: Neurology

## 2014-11-06 ENCOUNTER — Encounter: Payer: Self-pay | Admitting: Neurology

## 2014-11-06 VITALS — BP 118/60 | HR 70 | Temp 98.3°F | Resp 16 | Ht 63.0 in | Wt 157.7 lb

## 2014-11-06 DIAGNOSIS — F0280 Dementia in other diseases classified elsewhere without behavioral disturbance: Secondary | ICD-10-CM

## 2014-11-06 DIAGNOSIS — G309 Alzheimer's disease, unspecified: Secondary | ICD-10-CM

## 2014-11-06 MED ORDER — MEMANTINE HCL 10 MG PO TABS
ORAL_TABLET | ORAL | Status: DC
Start: 2014-11-06 — End: 2015-01-21

## 2014-11-06 NOTE — Progress Notes (Signed)
NEUROLOGY CONSULTATION NOTE  Haley Wang MRN: [id number] DOB: 01/14/1941  Referring provider: Dr. Christell Constant Primary care provider: Dr. Christell Constant  Reason for consult:  dementia  HISTORY OF PRESENT ILLNESS: Haley Wang is a 73 year old right-handed woman with hyperlipidemia, hypothyroidism, depression and IBS who presents for dementia.  Records and labs reviewed.  She is accompanied by her granddaughter-in-law (her primary caregiver) and her daughter, who provide most of the history.  Records and labs reviewed.  She began exhibiting cognitive changes approximately 7 years ago.  At that time, she was living by herself in an apartment.  The rule in the apartment was not to vacuum at night.  She would vacuum the home at night, thinking it was daytime.  She would exhibit some short term memory problems.  She would occasionally believe that her mother was still alive.  It gradually got worse over the years.  She began to exhibit hallucinations, delusions and paranoia.  She was sometimes see a man standing in the doorway.  One time, she believed that she was caring for children in her apartment.  She thought there was a cat under her floor.  She thought that somebody was coming for her.  She was no longer able to manage her finances about 3-4 years ago.  She has trouble recognizing family members that she does not see often.  One time, she mistaken her daughter for her sister.  She would go for walks and get lost.  She has been on seroquel for many years.  Ever since taking it, she has had nightmares.  It doesn't help with sleep and she will often be up in her room for a couple of hours during the night.  She has never wandered outside the house at night.  She moved in with her grandson and his family about 3 months ago because Home Health said she was not safe to live by herself.  It has been a difficult adjustment for her in her new living arrangements.  She is irritable daily.  She is combative about twice a  week.  She is able to bathe and dress herself with prompting.  Her granddaughter-in-law administers her medications.  There is no known family history of dementia, however her sister had Creutzfeldt-Jacob disease.  Labs from 07/17/14 showed TSH of 0.680.  PAST MEDICAL HISTORY: Past Medical History  Diagnosis Date  . Thyroid disease   . Arthritis   . Vitamin D deficiency   . Osteopenia   . Depression     PAST SURGICAL HISTORY: Past Surgical History  Procedure Laterality Date  . Tubal ligation    . Coronary artery bypass graft    . Abdominal hysterectomy      MEDICATIONS: Current Outpatient Prescriptions on File Prior to Visit  Medication Sig Dispense Refill  . atorvastatin (LIPITOR) 10 MG tablet TAKE ONE TABLET BY MOUTH DAILY 30 tablet 4  . clonazePAM (KLONOPIN) 0.5 MG tablet TAKE ONE TABLET BY MOUTH TWICE DAILY (MORNING AND IN THE EVENING) 60 tablet 1  . donepezil (ARICEPT) 10 MG tablet TAKE ONE TABLET BY MOUTH AT BEDTIME 30 tablet 2  . levothyroxine (SYNTHROID, LEVOTHROID) 75 MCG tablet TAKE ONE TABLET BY MOUTH ONCE DAILY BEFORE BREAKFAST 30 tablet 9  . meloxicam (MOBIC) 7.5 MG tablet Take 1 tablet (7.5 mg total) by mouth daily. 30 tablet 5  . polyethylene glycol powder (GLYCOLAX/MIRALAX) powder TAKE 17 GRAMS (1 CAPFUL) IN 8 OZ OF FLUID ONCE DAILY 527 g 2  .  SEROQUEL XR 50 MG TB24 24 hr tablet TAKE TWO (2) TABLETS BY MOUTH IN THE MORNING AND ONE TABLET IN THE EVENING 90 tablet 2  . venlafaxine XR (EFFEXOR XR) 150 MG 24 hr capsule Take 2 capsules (300 mg total) by mouth daily with breakfast. 60 capsule 5  . ciprofloxacin (CIPRO) 500 MG tablet Take 1 tablet (500 mg total) by mouth 2 (two) times daily. (Patient not taking: Reported on 11/06/2014) 6 tablet 0   No current facility-administered medications on file prior to visit.    ALLERGIES: No Known Allergies  FAMILY HISTORY: Family History  Problem Relation Age of Onset  . Depression Sister   . Asthma Mother   . Bipolar  disorder Son   . Bipolar disorder Daughter   . Cirrhosis Maternal Grandfather   . Cancer Paternal Grandfather     lung    SOCIAL HISTORY: History   Social History  . Marital Status: Widowed    Spouse Name: N/A    Number of Children: N/A  . Years of Education: N/A   Occupational History  . Not on file.   Social History Main Topics  . Smoking status: Former Games developermoker  . Smokeless tobacco: Never Used  . Alcohol Use: No  . Drug Use: No  . Sexual Activity: No   Other Topics Concern  . Not on file   Social History Narrative    REVIEW OF SYSTEMS: Constitutional: No fevers, chills, or sweats, no generalized fatigue, change in appetite Eyes: No visual changes, double vision, eye pain Ear, nose and throat: No hearing loss, ear pain, nasal congestion, sore throat Cardiovascular: No chest pain, palpitations Respiratory:  No shortness of breath at rest or with exertion, wheezes GastrointestinaI: No nausea, vomiting, diarrhea, abdominal pain, fecal incontinence Genitourinary:  No dysuria, urinary retention or frequency Musculoskeletal:  No neck pain, back pain Integumentary: No rash, pruritus, skin lesions Neurological: as above Psychiatric: No depression, insomnia, anxiety Endocrine: No palpitations, fatigue, diaphoresis, mood swings, change in appetite, change in weight, increased thirst Hematologic/Lymphatic:  No anemia, purpura, petechiae. Allergic/Immunologic: no itchy/runny eyes, nasal congestion, recent allergic reactions, rashes  PHYSICAL EXAM: Filed Vitals:   11/06/14 1017  BP: 118/60  Pulse: 70  Temp: 98.3 F (36.8 C)  Resp: 16   General: No acute distress Head:  Normocephalic/atraumatic Eyes:  fundi unremarkable, without vessel changes, exudates, hemorrhages or papilledema. Neck: supple, no paraspinal tenderness, full range of motion Back: No paraspinal tenderness Leg:  Tenderness to palpation of the thighs and hips Heart: regular rate and rhythm Lungs: Clear  to auscultation bilaterally. Vascular: No carotid bruits. Neurological Exam: Mental status: alert and oriented to self only (except she knew she was in a doctor's office but did not know the city), recent and remote memory impaired, fund of knowledge poor, attention and concentration poor, speech fluent and not dysarthric, able to name, some difficulty repeating, able to answer questions and follow commands.  Montreal Cognitive Assessment  11/06/2014  Visuospatial/ Executive (0/5) 0  Naming (0/3) 2  Attention: Read list of digits (0/2) 1  Attention: Read list of letters (0/1) 1  Attention: Serial 7 subtraction starting at 100 (0/3) 0  Language: Repeat phrase (0/2) 0  Language : Fluency (0/1) 0  Abstraction (0/2) 0  Delayed Recall (0/5) 0  Orientation (0/6) 1  Total 5  Adjusted Score (based on education) 6   Cranial nerves: CN I: not tested CN II: pupils equal, round and reactive to light, visual fields intact, fundi unremarkable, without  vessel changes, exudates, hemorrhages or papilledema. CN III, IV, VI:  full range of motion, no nystagmus, no ptosis CN V: facial sensation intact CN VII: upper and lower face symmetric CN VIII: hearing intact CN IX, X: gag intact, uvula midline CN XI: sternocleidomastoid and trapezius muscles intact CN XII: tongue midline Bulk & Tone: normal, no fasciculations. Motor:  5/5 throughout Sensation:  Temperature and vibration intact. Deep Tendon Reflexes:  2+ throughout, toes downgoing Finger to nose testing:  No dysmetria Gait:  With limp. Romberg negative.  IMPRESSION: Alzheimer's disease Right upper leg pain.  PLAN: 1.  Will get MRI of brain. 2.  Will start Namenda in addition to Aricept. 3.  Will discontinue seroquel and instead try Zyprexa 5mg  at bedtime 4.  24 hour supervision 5.  Alzheimer's support group 6.  Discuss leg pain with PCP 7.  Follow up in 3 months.  Thank you for allowing me to take part in the care of this  patient.  Shon MilletAdam Jaffe, DO  CC: Rudi Heaponald Moore, MD

## 2014-11-06 NOTE — Patient Instructions (Addendum)
1. Start Namenda (memantine) 10mg  tablets.  Take 1/2 tablet at bedtime for 7 days, then 1/2 tablet twice daily for 7 days, then 1/2 tablet in morning and 1 tablet at bedtime for 7 days, then 1 tablet twice daily.   Side effects include dizziness, headache, diarrhea or constipation.  Call with any questions or concerns. 2.  Continue the Aricept 10mg  daiy 3.  Stop seroquel.  Instead, we will try Zyprexa 5mg  at bedtime. 4.  We will get MRI of brain  [facility] 11/24/14 7:45am  5.  24 hour supervision 6.  Follow up in 3 months.

## 2014-11-10 ENCOUNTER — Telehealth: Payer: Self-pay | Admitting: Neurology

## 2014-11-10 NOTE — Telephone Encounter (Signed)
Message left with answering service, caller says her pharmacy has not received script for new meds prescribed on 11/06/14. Please follow up with pharmacy and patient. Patient CB# [phone] / [name]

## 2014-11-12 ENCOUNTER — Telehealth: Payer: Self-pay | Admitting: *Deleted

## 2014-11-12 NOTE — Telephone Encounter (Signed)
Patient calling in reference to a medication refill Call back number [phone]

## 2014-11-13 ENCOUNTER — Telehealth: Payer: Self-pay | Admitting: *Deleted

## 2014-11-13 NOTE — Telephone Encounter (Signed)
.   Start Namenda (memantine) 10mg  tablets.  Take 1/2 tablet at bedtime for 7 days, then 1/2 tablet twice daily for 7 days, then 1/2 tablet in morning and 1 tablet at bedtime for 7 days, then 1 tablet twice daily.   and Zyprexa 5 mg 1 po at HS # 30 with  2 susanne is aware this has been called in . escribe did not make it to [facility]

## 2014-11-13 NOTE — Telephone Encounter (Signed)
Darl PikesSusan, pt's granddaughter in law called following up the refill for MEMANTINE 10 MG & ZYPREXA 5mg  Pharmacy: [facility] in [place]  C/b [phone]

## 2014-11-21 ENCOUNTER — Telehealth: Payer: Self-pay | Admitting: Neurology

## 2014-11-21 NOTE — Telephone Encounter (Signed)
Lynnae SandhoffSusanne, pt's granddaughter called wanting to speak to Dr. Everlena CooperJaffe regarding trying to set the pt up To live in a nursing home or assisting living. Pt has mentioned that she will kill her self.  C/B [phone]

## 2014-11-21 NOTE — Telephone Encounter (Signed)
See previous documentation, is this ready to be closed? If so please close encounter / [name]

## 2014-11-21 NOTE — Telephone Encounter (Signed)
Looks like documented in separate encounter.

## 2014-11-21 NOTE — Telephone Encounter (Signed)
Spoke with patient's granddaughter. She states that patient has talked about suicide on and off. She has not expressed a plan and not mentioning it now, but did earlier today. I advised if patient threatening suicide she needs to be taken to the ER immediately. If admitted to hospital they can arrange d/c to a facility if they deem necessary. We are unable to place patients in a nursing facility. This is completely up to family to find a facility that they find suitable for their loved ones. She expressed understanding.

## 2014-11-24 ENCOUNTER — Ambulatory Visit (HOSPITAL_COMMUNITY): Admission: RE | Admit: 2014-11-24 | Payer: Medicare HMO | Source: Ambulatory Visit

## 2014-11-25 ENCOUNTER — Telehealth: Payer: Self-pay | Admitting: Nurse Practitioner

## 2014-11-27 NOTE — Telephone Encounter (Signed)
FL2 here and ready, they will pick up when they come for TB test

## 2014-11-28 ENCOUNTER — Other Ambulatory Visit: Payer: Self-pay | Admitting: Nurse Practitioner

## 2014-12-02 ENCOUNTER — Ambulatory Visit (INDEPENDENT_AMBULATORY_CARE_PROVIDER_SITE_OTHER): Payer: Commercial Managed Care - HMO | Admitting: *Deleted

## 2014-12-02 DIAGNOSIS — Z111 Encounter for screening for respiratory tuberculosis: Secondary | ICD-10-CM | POA: Diagnosis not present

## 2014-12-02 NOTE — Progress Notes (Signed)
Ppd skin test placed on left forearm and tolerated well.

## 2014-12-02 NOTE — Patient Instructions (Signed)

## 2014-12-04 LAB — TB SKIN TEST
Induration: 0 mm
TB Skin Test: NEGATIVE

## 2014-12-09 ENCOUNTER — Ambulatory Visit (HOSPITAL_COMMUNITY): Admission: RE | Admit: 2014-12-09 | Payer: Medicare HMO | Source: Ambulatory Visit

## 2014-12-19 ENCOUNTER — Encounter: Payer: Self-pay | Admitting: Gastroenterology

## 2014-12-25 ENCOUNTER — Other Ambulatory Visit: Payer: Self-pay | Admitting: Nurse Practitioner

## 2014-12-26 NOTE — Telephone Encounter (Signed)
Please call in klonopin with 1 refills 

## 2014-12-26 NOTE — Telephone Encounter (Signed)
rx called into pharmacy

## 2014-12-26 NOTE — Telephone Encounter (Signed)
Last filled 12/01/14, last seen 10/15/14. Call into [facility] [phone]

## 2014-12-30 ENCOUNTER — Telehealth: Payer: Self-pay | Admitting: Nurse Practitioner

## 2015-01-01 NOTE — Telephone Encounter (Signed)
FL2 finished. Family aware

## 2015-01-21 ENCOUNTER — Other Ambulatory Visit: Payer: Self-pay | Admitting: Nurse Practitioner

## 2015-01-21 ENCOUNTER — Other Ambulatory Visit: Payer: Self-pay | Admitting: Neurology

## 2015-02-06 ENCOUNTER — Ambulatory Visit: Payer: Commercial Managed Care - HMO | Admitting: Neurology

## 2015-02-10 ENCOUNTER — Telehealth: Payer: Self-pay | Admitting: Nurse Practitioner

## 2015-02-12 NOTE — Telephone Encounter (Signed)
Done, daughter aware

## 2015-02-17 ENCOUNTER — Other Ambulatory Visit: Payer: Self-pay | Admitting: Nurse Practitioner

## 2015-02-17 ENCOUNTER — Other Ambulatory Visit: Payer: Self-pay | Admitting: Family Medicine

## 2015-02-18 NOTE — Telephone Encounter (Signed)
Please call in klonopin with 0 no more refills without being seen  refills

## 2015-02-18 NOTE — Telephone Encounter (Signed)
Last seen 10/15/14 MMM If approved route to nurse to [facility]  [phone]

## 2015-02-26 ENCOUNTER — Telehealth: Payer: Self-pay | Admitting: Family Medicine

## 2015-03-03 ENCOUNTER — Telehealth: Payer: Self-pay | Admitting: Nurse Practitioner

## 2015-03-03 NOTE — Telephone Encounter (Signed)
FL2 given to daughter on 02/21/15. Daughter confirmed she had.

## 2015-03-04 NOTE — Telephone Encounter (Signed)
done

## 2015-03-09 DIAGNOSIS — Z029 Encounter for administrative examinations, unspecified: Secondary | ICD-10-CM

## 2015-03-24 ENCOUNTER — Telehealth: Payer: Self-pay

## 2015-03-24 NOTE — Telephone Encounter (Signed)
This [facility] is getting [facility] and there is a question about her Effexor  Last visit in 12/15 said 150 one daily and under medications it say 2 daily   Please clarify and call [facility] back

## 2015-03-24 NOTE — Telephone Encounter (Signed)
[facility].

## 2015-03-24 NOTE — Telephone Encounter (Signed)
Chart says that she is taking 2 a day

## 2015-03-25 ENCOUNTER — Non-Acute Institutional Stay (SKILLED_NURSING_FACILITY): Payer: Commercial Managed Care - HMO | Admitting: Internal Medicine

## 2015-03-25 DIAGNOSIS — D6489 Other specified anemias: Secondary | ICD-10-CM | POA: Diagnosis not present

## 2015-03-25 DIAGNOSIS — G309 Alzheimer's disease, unspecified: Secondary | ICD-10-CM

## 2015-03-25 DIAGNOSIS — F03918 Unspecified dementia, unspecified severity, with other behavioral disturbance: Secondary | ICD-10-CM

## 2015-03-25 DIAGNOSIS — F0391 Unspecified dementia with behavioral disturbance: Secondary | ICD-10-CM

## 2015-03-25 DIAGNOSIS — E038 Other specified hypothyroidism: Secondary | ICD-10-CM | POA: Diagnosis not present

## 2015-03-25 DIAGNOSIS — F0280 Dementia in other diseases classified elsewhere without behavioral disturbance: Secondary | ICD-10-CM

## 2015-04-01 NOTE — Progress Notes (Addendum)
Patient ID: Haley Wang, female   DOB: Feb 06, 1941, 74 y.o.   MRN: [id number]                 HISTORY & PHYSICAL  DATE:  03/25/2015         [facility]                       LEVEL OF CARE:   SNF   CHIEF COMPLAINT:  Admission to SNF, arriving I believe from home.    HISTORY OF PRESENT ILLNESS:  Haley Wang is a lady who apparently suffers from dementia.  She [facility], where I see that she was argumentative and combative at home.     She was seen by Neurology in December 2015.  She was diagnosed with Alzheimer's disease.  She had a series of tests ordered including an MRI, although I cannot see that that was actually done, at least not within the [facility] system.    She has arrived in the facility.  She is already wandering.  They are making plans to move her to the locked unit.      PAST MEDICAL HISTORY/PROBLEM LIST:              "Thyroid disease."      Arthritis.    Vitamin D deficiency.    Osteopenia.     Depression.    PAST SURGICAL HISTORY:               Tubal ligation.    Coronary artery bypass graft.    Abdominal hysterectomy.     CURRENT MEDICATIONS:  Medication list is reviewed.                    Atorvastatin 10 q.d.      Clonazepam 0.5 b.i.d.       Aricept 10 q.d.       Synthroid 75 q.d.       Mobic 7.5 p.o. q.d.      Namenda 10 b.i.d.        MiraLAX 17 g daily.      Zyprexa 5 mg daily.     Effexor XR 300 mg p.o. q.a.m.        SOCIAL HISTORY:                   TOBACCO USE:  She is a former smoker.    ALCOHOL:  No alcohol use.   HOUSING:  The patient states she was living at home with her husband, although I noticed in [facility] that her marital status is "widowed".   ADVANCED DIRECTIVES:  She does not come with any advanced directives.    FAMILY HISTORY:    CHILDREN:  Noted for bipolar disorder in a son and a daughter.   SIBLINGS:  One sister has depression.    REVIEW  OF SYSTEMS:   Not really possible in this patient due to dementia.    PHYSICAL EXAMINATION:   GENERAL APPEARANCE:  The patient looks well.  Up walking in the corridor.      CHEST/RESPIRATORY:  Clear air entry bilaterally.     CARDIOVASCULAR:   CARDIAC:  Heart sounds are normal.  There are no murmurs.  No gallops.  She appears to be euvolemic.   GASTROINTESTINAL:   ABDOMEN:  No masses.     LIVER/SPLEEN/KIDNEY:   No liver, no spleen.   GENITOURINARY:  BLADDER:  No bladder distention.  No CVA tenderness.   CIRCULATION:   EDEMA/VARICOSITIES:  Extremities:  No edema.     NEUROLOGICAL:  Her exam is nonfocal.     DEEP TENDON REFLEXES:  Reflexes are symmetric.   BALANCE/GAIT:  She appears to be very stable on her feet.  Actually looks like a visitor in the facility.   PSYCHIATRIC:   MENTAL STATUS:    She could not tell me the year, month, or place.  However, she could tell me her date of birth.  Says she has four children and was living with her husband.  According to the speech therapist, she scored 2/30 on their cognitive screen tool.    ASSESSMENT/PLAN:                      Dementia with behavioral disturbances.  She was apparently not sleeping, combative and restless at home.  This may be why she is on the Zyprexa.  She is not overtly psychotic.  Once we stabilize her perhaps in the secure unit of the building, then we can attempt withdraw the Zyprexa.    History of depression and anxiety.  Apparently this was severe, per her primary care notes.  This appears to be quite stable at the moment.    Hypothyroidism.  On replacement.   Last TSH [facility] was 0.680 on 07/17/2014.    Hyperlipidemia.  On treatment.   I do not see a recent lipid panel.    She had an extensive anemia work-up in April 2014, although she does not appear to have been anemic at that time.    History of coronary artery disease.  This does not appear to be active.    Listed as having Alzheimer's disease by her  consultant neurologist.  She is on Aricept and Namenda.  I will continue this.  Her dementia is likely to be moderate to severe.  She is, however, wandering; and given that this is a difficult phenomenon in the facility, the secure unit seems reasonable.     CPT CODE: 16109           ADDENDUM:    At some point, I will try to have end-of-life discussions with family members.

## 2015-05-06 ENCOUNTER — Non-Acute Institutional Stay: Payer: Commercial Managed Care - HMO | Admitting: Internal Medicine

## 2015-05-06 DIAGNOSIS — F418 Other specified anxiety disorders: Secondary | ICD-10-CM

## 2015-05-06 DIAGNOSIS — E785 Hyperlipidemia, unspecified: Secondary | ICD-10-CM | POA: Diagnosis not present

## 2015-05-06 DIAGNOSIS — F0391 Unspecified dementia with behavioral disturbance: Secondary | ICD-10-CM | POA: Diagnosis not present

## 2015-05-06 DIAGNOSIS — F03918 Unspecified dementia, unspecified severity, with other behavioral disturbance: Secondary | ICD-10-CM

## 2015-05-12 NOTE — Progress Notes (Addendum)
Patient ID: Haley Wang, female   DOB: 12-12-1940, 74 y.o.   MRN: [id number]                PROGRESS NOTE  DATE:  05/06/2015             [facility]                      LEVEL OF CARE:   SNF   Acute Visit                               CHIEF COMPLAINT:  Routine visit to follow issues surrounding cognition.    HISTORY OF PRESENT ILLNESS:  This is a patient with Alzheimer's disease, [facility].  She is on Namenda 10 b.i.d., clonazepam 0.5 b.i.d., Zyprexa 5 mg daily, and Effexor 300 q.a.m.      She is a patient who was previously cared for [facility] where her depression was listed as severe in the past.  She does not seem depressed currently.    Initially, I felt she was wandering severely enough on arrival to the facility to warrant putting her in the locked ward, although they have put her in a less traveled area in the building and she seems to be doing well.  She can correctly identify which was her room, but had trouble telling me which one of the beds in her room was hers.    PHYSICAL EXAMINATION:   CHEST/RESPIRATORY:  Clear air entry bilaterally.    CARDIOVASCULAR:   CARDIAC:  Heart sounds are normal.  There are no murmurs.    GASTROINTESTINAL:   ABDOMEN:  Slightly distended.  No masses.   LIVER/SPLEEN/KIDNEYS:  No liver, no spleen.   GENITOURINARY:   BLADDER:  No suprapubic fullness or tenderness.  No CVA tenderness.    ASSESSMENT/PLAN:                          Dementia with behavioral disturbances.  She was apparently not sleeping, combative, and restless at home.  I have not seen any of this in the facility.  I am going to begin to taper the Zyprexa, currently at 5 mg, to 2.5.  I will ask the staff to continue to update me on behavioral issues.    History of depression.  Listed as quite severe.  She had coexistent anxiety.  I am not going to change the clonazepam and Effexor at this point.     Hyperlipidemia.  Her cholesterol panel from 04/10/2015 showed an LDL of 70.  All of this was quite stable.

## 2015-06-03 ENCOUNTER — Non-Acute Institutional Stay (SKILLED_NURSING_FACILITY): Payer: Medicare Other | Admitting: Internal Medicine

## 2015-06-03 DIAGNOSIS — F418 Other specified anxiety disorders: Secondary | ICD-10-CM

## 2015-06-03 DIAGNOSIS — E785 Hyperlipidemia, unspecified: Secondary | ICD-10-CM | POA: Diagnosis not present

## 2015-06-03 DIAGNOSIS — F0391 Unspecified dementia with behavioral disturbance: Secondary | ICD-10-CM | POA: Diagnosis not present

## 2015-06-03 DIAGNOSIS — F03918 Unspecified dementia, unspecified severity, with other behavioral disturbance: Secondary | ICD-10-CM

## 2015-06-12 NOTE — Progress Notes (Addendum)
Patient ID: Haley Wang, female   DOB: Jul 08, 1941, 74 y.o.   MRN: [id number]                PROGRESS NOTE  DATE:  06/03/2015        [name]                 LEVEL OF CARE:   SNF   Routine Visit               CHIEF COMPLAINT:  Routine visit to follow medical issues/routine [employer] visit.    HISTORY OF PRESENT ILLNESS:  This is a patient with Alzheimer's disease who was admitted to the building in May.  She was actually admitted from home.  She received her primary care at [facility] and I was able to see references to being argumentative and combative at home.    She had been seen by [facility] in December 2015, diagnosed with Alzheimer's disease.  She had an MRI ordered, although I have never been able to see that.  If it was done, it was not done within the [facility] system.  Presumably, she would not cooperate.    When she was first admitted to the facility, they put her in a usual bed.  She was wandering and agitated, I think mostly due to unfamiliar surroundings.  They have since put her on a less traveled area of the facility and things seem to be going well from that regard.    CURRENT MEDICATIONS:  Medication list is reviewed.            Lipitor 10 mg q.d.     Synthroid 75 q.d.     Mobic 7.5 q.d.      MiraLAX 17 g daily.    Effexor 300 daily.      Klonopin 0.5 b.i.d.       Namenda 10 mg b.i.d.      Aricept 10 mg q.h.s.      Zyprexa 2.5 p.o. q.h.s.   I have not been updated on any psychiatric problems related to the tapering of Zyprexa.    REVIEW OF SYSTEMS:   Of dubious usefullness secondary to her dementia.   CHEST/RESPIRATORY:  No cough.  No sputum.     CARDIAC:  No chest pain.     GI:  No nausea or vomiting.   GU:  No dysuria.    MUSCULOSKELETAL:  No peripheral pain.       PHYSICAL EXAMINATION:   VITAL SIGNS:     TEMPERATURE:  98.3.    PULSE:  67.     BLOOD PRESSURE:  120/78.     02 SATURATIONS:  97% on room  air.     WEIGHT:  171 pounds.   GENERAL APPEARANCE:  The patient is not in any distress.      CHEST/RESPIRATORY:  Clear air entry bilaterally.    CARDIOVASCULAR:   CARDIAC:  Heart sounds are normal.  There are no murmurs.    GASTROINTESTINAL:   ABDOMEN:  Slightly distended.  No masses felt.     LIVER/SPLEEN/KIDNEYS:  No liver, no spleen.   GENITOURINARY:   BLADDER:  Not distended.  There is no costovertebral angle tenderness.      ASSESSMENT/PLAN:                    Dementia with behavior disturbances.  She was seen by Psychiatry, who did not add anything further except to follow  the taper of Zyprexa that I had already initiated.  She is not overtly psychotic.  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  I do not believe any additional medications were ordered.    History of depression.  Listed as quite severe.  She is on clonazepam and Effexor, and I think she has been stable since she has been in the building.  Again, Psychiatry is following this.    Hyperlipidemia.  Cholesterol panel from 04/10/2015 showed an LDL of 70.    History of osteopenia with vitamin D deficiency.  I did not actually see a vitamin D level in [facility] although I note that one has already been ordered as is customary for [employer], and then the routine dose of Vitamin D3, 50,000 U monthly.

## 2015-06-30 ENCOUNTER — Encounter: Payer: Self-pay | Admitting: Gastroenterology

## 2015-07-27 ENCOUNTER — Telehealth: Payer: Self-pay | Admitting: Family Medicine

## 2015-11-25 ENCOUNTER — Other Ambulatory Visit: Payer: Medicare Other

## 2016-06-17 IMAGING — CR DG ABDOMEN 1V
1 series · 1 of 1 positions shown · non-contrast
Comparison: 12/17/2013

CLINICAL DATA: Abdominal pain and bloating.

EXAM:
ABDOMEN - 1 VIEW

[view not recorded]
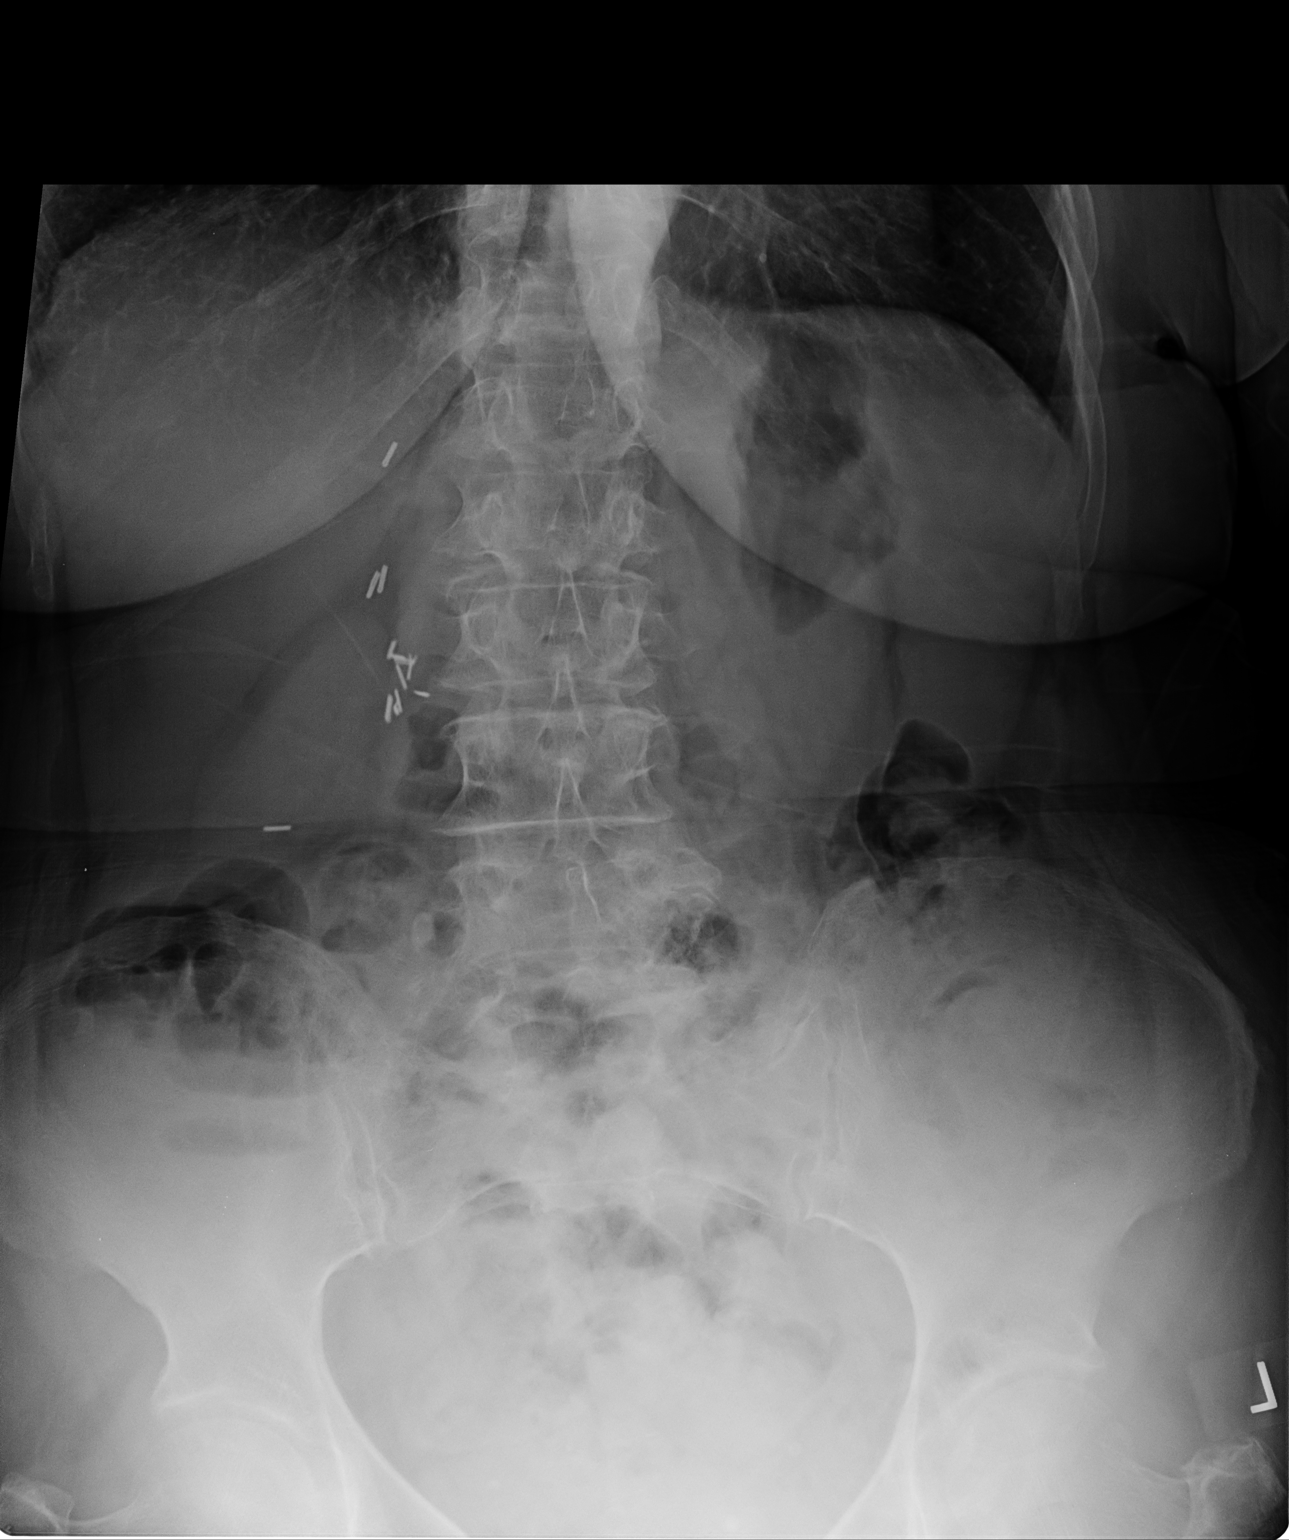

[1 of 1 positions shown; findings below may reference images not displayed]

FINDINGS: There are no dilated loops of large or small bowel. No free air or
free fluid is visible. Multiple surgical clips in the right side of
the abdomen. No acute osseous abnormality.
IMPRESSION: Benign appearing abdomen.

## 2017-04-14 ENCOUNTER — Emergency Department (HOSPITAL_COMMUNITY)
Admission: EM | Admit: 2017-04-14 | Discharge: 2017-04-14 | Disposition: A | Discharge status: Home / Self Care | Payer: Medicare Other | Attending: Emergency Medicine | Admitting: Emergency Medicine

## 2017-04-14 ENCOUNTER — Encounter (HOSPITAL_COMMUNITY): Payer: Self-pay | Admitting: *Deleted

## 2017-04-14 DIAGNOSIS — Y92129 Unspecified place in nursing home as the place of occurrence of the external cause: Secondary | ICD-10-CM | POA: Diagnosis not present

## 2017-04-14 DIAGNOSIS — E039 Hypothyroidism, unspecified: Secondary | ICD-10-CM | POA: Insufficient documentation

## 2017-04-14 DIAGNOSIS — Y999 Unspecified external cause status: Secondary | ICD-10-CM | POA: Diagnosis not present

## 2017-04-14 DIAGNOSIS — F0390 Unspecified dementia without behavioral disturbance: Secondary | ICD-10-CM | POA: Insufficient documentation

## 2017-04-14 DIAGNOSIS — Y939 Activity, unspecified: Secondary | ICD-10-CM | POA: Insufficient documentation

## 2017-04-14 DIAGNOSIS — Z79899 Other long term (current) drug therapy: Secondary | ICD-10-CM | POA: Diagnosis not present

## 2017-04-14 DIAGNOSIS — Z87891 Personal history of nicotine dependence: Secondary | ICD-10-CM | POA: Insufficient documentation

## 2017-04-14 DIAGNOSIS — S0990XA Unspecified injury of head, initial encounter: Secondary | ICD-10-CM | POA: Insufficient documentation

## 2017-04-14 DIAGNOSIS — W050XXA Fall from non-moving wheelchair, initial encounter: Secondary | ICD-10-CM | POA: Insufficient documentation

## 2017-04-14 NOTE — ED Notes (Signed)
Pt sheets and adult briefs changed prior to discharge and transport back to Nursing Home facility.

## 2017-04-14 NOTE — ED Triage Notes (Signed)
Pt arrived via EMS [facility].  Staff report a witness fall from wheelchair approximately 1 hour ago hitting head.  Pt has history of dementia and unable to answer questions.  There are no apparent marks, bruises, abrasions or swelling to face, neck or head.

## 2017-04-14 NOTE — Discharge Instructions (Signed)
Tylenol as needed for pain ER for worsening symptoms

## 2017-04-14 NOTE — ED Provider Notes (Signed)
[facility] DEPT Provider Note   CSN: [id number] Arrival date & time: 04/14/17  1130     History   Chief Complaint Chief Complaint  Patient presents with  . Fall    From wheelchair    HPI Haley Wang is a 76 y.o. female.  HPI  The patient is a 76 year old female, history of dementia, she is unable to give any history secondary to her dementia vessels level V caveat applies. According to the staff at her nursing facility she had a fall from her wheelchair where she slipped out and bumped her head. There is no loss of consciousness seizures vomiting or any other complaints. She was sent for evaluation of this head injury after a fall.  Past Medical History:  Diagnosis Date  . Arthritis   . Depression   . Osteopenia   . Thyroid disease   . Vitamin D deficiency     Patient Active Problem List   Diagnosis Date Noted  . Dementia 08/25/2014  . Depression with anxiety 02/14/2013  . Hypothyroidism 02/14/2013  . CONSTIPATION 10/26/2009  . HEMATOCHEZIA 10/26/2009  . DYSPHAGIA 10/26/2009  . FECAL OCCULT BLOOD 10/26/2009    Past Surgical History:  Procedure Laterality Date  . ABDOMINAL HYSTERECTOMY    . CORONARY ARTERY BYPASS GRAFT    . TUBAL LIGATION      OB History    No data available       Home Medications    Prior to Admission medications   Medication Sig Start Date End Date Taking? Authorizing Provider  acetaminophen (TYLENOL) 325 MG tablet Take 650 mg by mouth 2 (two) times daily.   Yes [provider]  acetaminophen (TYLENOL) 500 MG tablet Take 500 mg by mouth every 6 (six) hours as needed.   Yes [provider]  clonazePAM (KLONOPIN) 0.5 MG tablet TAKE ONE TABLET BY MOUTH TWICE DAILY (MORNING AND IN THE EVENING) Patient taking differently: take 1/2 tablet by mouth three times daily as needed for agitation 02/18/15  Yes Daphine Deutscher, Mary-Margaret, FNP  DESVENLAFAXINE ER PO Take 25 mg by mouth daily.   Yes [provider]  divalproex  (DEPAKOTE) 500 MG DR tablet Take 500 mg by mouth 2 (two) times daily.   Yes [provider]  levothyroxine (SYNTHROID, LEVOTHROID) 75 MCG tablet TAKE ONE TABLET BY MOUTH ONCE DAILY BEFORE BREAKFAST (NOON) 01/22/15  Yes Daphine Deutscher, Mary-Margaret, FNP  Magnesium Hydroxide ([employer] MILK OF MAGNESIA PO) Take 30 mLs by mouth 2 (two) times daily as needed (constipation).   Yes [provider]  memantine (NAMENDA) 10 MG tablet TAKE ONE TABLET BY MOUTH TWICE DAILY (MORNING AND IN THE EVENING) 01/21/15  Yes Jaffe, Adam R, DO  polyethylene glycol powder (GLYCOLAX/MIRALAX) powder TAKE 17 GRAMS (1 CAPFUL) IN 8 OZ OF FLUID ONCE DAILY 05/20/14  Yes Daphine Deutscher, Mary-Margaret, FNP  QUEtiapine (SEROQUEL) 100 MG tablet Take 100 mg by mouth every morning.   Yes [provider]  QUEtiapine (SEROQUEL) 100 MG tablet Take 150 mg by mouth at bedtime.   Yes [provider]  atorvastatin (LIPITOR) 10 MG tablet TAKE ONE TABLET BY MOUTH ONCE DAILY. (IN THE EVENING) Patient not taking: Reported on 04/14/2017 02/18/15   Bennie Pierini, FNP  donepezil (ARICEPT) 10 MG tablet TAKE ONE TABLET BY MOUTH AT BEDTIME. Patient not taking: Reported on 04/14/2017 11/28/14   Bennie Pierini, FNP  meloxicam (MOBIC) 7.5 MG tablet TAKE ONE TABLET BY MOUTH DAILY Patient not taking: Reported on 04/14/2017 02/19/15   Bennie Pierini,  FNP  OLANZapine (ZYPREXA) 5 MG tablet TAKE ONE TABLET BY MOUTH AT BEDTIME. Patient not taking: Reported on 04/14/2017 01/21/15   Shon Millet R, DO  SEROQUEL XR 50 MG TB24 24 hr tablet TAKE TWO (2) TABLETS BY MOUTH EVERY MORNING AND TAKE ONE TABLET EVERY EVENING. Patient not taking: Reported on 04/14/2017 11/28/14   Bennie Pierini, FNP  venlafaxine XR (EFFEXOR XR) 150 MG 24 hr capsule Take 2 capsules (300 mg total) by mouth daily with breakfast. Patient not taking: Reported on 04/14/2017 10/17/14   Bennie Pierini, FNP    Family History Family History  Problem Relation Age of  Onset  . Depression Sister   . Asthma Mother   . Bipolar disorder Son   . Bipolar disorder Daughter   . Cirrhosis Maternal Grandfather   . Cancer Paternal Grandfather        lung    Social History Social History  Substance Use Topics  . Smoking status: Former Games developer  . Smokeless tobacco: Never Used  . Alcohol use No     Allergies   Patient has no known allergies.   Review of Systems Review of Systems  Unable to perform ROS: Dementia     Physical Exam Updated Vital Signs BP 133/77 (BP Location: Right Arm)   Pulse 90   Temp 98.3 F (36.8 C) (Oral)   Resp 16   SpO2 100%   Physical Exam  Constitutional: She appears well-developed and well-nourished. No distress.  HENT:  Head: Normocephalic and atraumatic.  Mouth/Throat: Oropharynx is clear and moist. No oropharyngeal exudate.  No hematomas contusions abrasions or lacerations about the head or the scalp or the face. No raccoon eyes, no battle sign, no malocclusion  Eyes: Conjunctivae and EOM are normal. Pupils are equal, round, and reactive to light. Right eye exhibits no discharge. Left eye exhibits no discharge. No scleral icterus.  Neck: Normal range of motion. Neck supple. No JVD present. No thyromegaly present.  Cardiovascular: Normal rate, regular rhythm, normal heart sounds and intact distal pulses.  Exam reveals no gallop and no friction rub.   No murmur heard. Pulmonary/Chest: Effort normal and breath sounds normal. No respiratory distress. She has no wheezes. She has no rales.  Abdominal: Soft. Bowel sounds are normal. She exhibits no distension and no mass. There is no tenderness.  Musculoskeletal: Normal range of motion. She exhibits no edema or tenderness.  The patient has very little range of motion of any of her 4 extremities but there does not appear to be any overt injuries. She has a couple of very small bruises to her right and left leg but nothing around the joints. She has soft compartments  diffusely.  Lymphadenopathy:    She has no cervical adenopathy.  Neurological: She is alert. Coordination normal.  The patient is able to follow very supple commands, she does not answer my questions with oriented answers but does speak, mumbles  Skin: Skin is warm and dry. No rash noted. No erythema.  No skin tears and abrasions contusions or hematomas  Psychiatric: She has a normal mood and affect. Her behavior is normal.  Nursing note and vitals reviewed.    ED Treatments / Results  Labs (all labs ordered are listed, but only abnormal results are displayed) Labs Reviewed - No data to display   Radiology No results found.  Procedures Procedures (including critical care time)  Medications Ordered in ED Medications - No data to display   Initial Impression / Assessment and Plan /  ED Course  I have reviewed the triage vital signs and the nursing notes.  Pertinent labs & imaging results that were available during my care of the patient were reviewed by me and considered in my medical decision making (see chart for details).     The patient had a minor injury after a slip from her wheelchair. I doubt this is causing intracranial hemorrhage, there isovert signs of scalp injury, head injury or facial injury. She seems to be moving all 4 extremities suggesting no neurologic injury. She will be sent back to NH, Tylenol as needed, returning for worsening condition.  Final Clinical Impressions(s) / ED Diagnoses   Final diagnoses:  Minor head injury, initial encounter    New Prescriptions New Prescriptions   No medications on file     Eber HongMiller, Georgie Eduardo, MD 04/14/17 1214

## 2017-10-07 DEATH — deceased
